# Patient Record
Sex: Male | Born: 1980
Health system: Southern US, Community
[De-identification: ages and names within clinical notes are randomized; demographics above are authoritative.]

## PROBLEM LIST (undated history)

## (undated) DIAGNOSIS — K219 Gastro-esophageal reflux disease without esophagitis: Secondary | ICD-10-CM

## (undated) DIAGNOSIS — R0683 Snoring: Secondary | ICD-10-CM

## (undated) DIAGNOSIS — I10 Essential (primary) hypertension: Secondary | ICD-10-CM

## (undated) HISTORY — PX: CHOLECYSTECTOMY: SHX55

## (undated) HISTORY — PX: OTHER SURGICAL HISTORY: SHX169

## (undated) HISTORY — DX: Gastro-esophageal reflux disease without esophagitis: K21.9

---

## 2004-03-12 ENCOUNTER — Ambulatory Visit (HOSPITAL_BASED_OUTPATIENT_CLINIC_OR_DEPARTMENT_OTHER): Admission: RE | Admit: 2004-03-12 | Discharge: 2004-03-12 | Payer: Self-pay | Admitting: Otolaryngology

## 2006-02-08 ENCOUNTER — Emergency Department: Payer: Self-pay | Admitting: Emergency Medicine

## 2015-06-23 ENCOUNTER — Emergency Department (HOSPITAL_COMMUNITY)
Admission: EM | Admit: 2015-06-23 | Discharge: 2015-06-23 | Disposition: A | Discharge status: Home / Self Care | Payer: No Typology Code available for payment source | Attending: Emergency Medicine | Admitting: Emergency Medicine

## 2015-06-23 ENCOUNTER — Encounter (HOSPITAL_COMMUNITY): Payer: Self-pay | Admitting: Emergency Medicine

## 2015-06-23 ENCOUNTER — Emergency Department (HOSPITAL_COMMUNITY): Payer: No Typology Code available for payment source

## 2015-06-23 DIAGNOSIS — K529 Noninfective gastroenteritis and colitis, unspecified: Secondary | ICD-10-CM | POA: Diagnosis not present

## 2015-06-23 DIAGNOSIS — R109 Unspecified abdominal pain: Secondary | ICD-10-CM | POA: Diagnosis present

## 2015-06-23 LAB — LIPASE, BLOOD: Lipase: 37 U/L (ref 11–51)

## 2015-06-23 LAB — COMPREHENSIVE METABOLIC PANEL
ALK PHOS: 83 U/L (ref 38–126)
ALT: 93 U/L — ABNORMAL HIGH (ref 17–63)
AST: 41 U/L (ref 15–41)
Albumin: 4.3 g/dL (ref 3.5–5.0)
Anion gap: 6 (ref 5–15)
BILIRUBIN TOTAL: 0.7 mg/dL (ref 0.3–1.2)
BUN: 14 mg/dL (ref 6–20)
CALCIUM: 9.7 mg/dL (ref 8.9–10.3)
CO2: 27 mmol/L (ref 22–32)
Chloride: 103 mmol/L (ref 101–111)
Creatinine, Ser: 1.18 mg/dL (ref 0.61–1.24)
GFR calc Af Amer: >60 mL/min (ref >60)
Glucose, Bld: 133 mg/dL — ABNORMAL HIGH (ref 65–99)
POTASSIUM: 5.3 mmol/L — AB (ref 3.5–5.1)
Sodium: 136 mmol/L (ref 135–145)
TOTAL PROTEIN: 7.7 g/dL (ref 6.5–8.1)

## 2015-06-23 LAB — URINALYSIS, ROUTINE W REFLEX MICROSCOPIC
BILIRUBIN URINE: NEGATIVE
Glucose, UA: NEGATIVE mg/dL
Hgb urine dipstick: NEGATIVE
KETONES UR: NEGATIVE mg/dL
LEUKOCYTES UA: NEGATIVE
NITRITE: NEGATIVE
PH: 6.5 (ref 5.0–8.0)
PROTEIN: NEGATIVE mg/dL
Specific Gravity, Urine: 1.026 (ref 1.005–1.030)

## 2015-06-23 LAB — CBC
HEMATOCRIT: 44.5 % (ref 39.0–52.0)
Hemoglobin: 15.9 g/dL (ref 13.0–17.0)
MCH: 32.8 pg (ref 26.0–34.0)
MCHC: 35.7 g/dL (ref 30.0–36.0)
MCV: 91.8 fL (ref 78.0–100.0)
PLATELETS: 229 10*3/uL (ref 150–400)
RBC: 4.85 MIL/uL (ref 4.22–5.81)
RDW: 12.3 % (ref 11.5–15.5)
WBC: 8.6 10*3/uL (ref 4.0–10.5)

## 2015-06-23 MED ORDER — ONDANSETRON 4 MG PO TBDP: 4.0000 mg | ORAL_TABLET | Freq: Three times a day (TID) | ORAL | Status: DC | PRN

## 2015-06-23 MED ORDER — DICYCLOMINE HCL 20 MG PO TABS: 20.0000 mg | ORAL_TABLET | Freq: Two times a day (BID) | ORAL | Status: DC

## 2015-06-23 MED ORDER — GI COCKTAIL ~~LOC~~
30.0000 mL | Freq: Once | ORAL | Status: AC
  Administered 2015-06-23: 30 mL via ORAL
  Filled 2015-06-23: qty 30

## 2015-06-23 MED ORDER — ONDANSETRON HCL 4 MG/2ML IJ SOLN
4.0000 mg | Freq: Once | INTRAMUSCULAR | Status: AC
  Administered 2015-06-23: 4 mg via INTRAVENOUS
  Filled 2015-06-23: qty 2

## 2015-06-23 MED ORDER — DICYCLOMINE HCL 10 MG PO CAPS
10.0000 mg | ORAL_CAPSULE | Freq: Once | ORAL | Status: AC
  Administered 2015-06-23: 10 mg via ORAL
  Filled 2015-06-23: qty 1

## 2015-06-23 MED ORDER — MORPHINE SULFATE (PF) 4 MG/ML IV SOLN
4.0000 mg | Freq: Once | INTRAVENOUS | Status: AC
  Administered 2015-06-23: 4 mg via INTRAVENOUS
  Filled 2015-06-23: qty 1

## 2015-06-23 MED ORDER — SODIUM CHLORIDE 0.9 % IV BOLUS (SEPSIS)
1000.0000 mL | Freq: Once | INTRAVENOUS | Status: AC
  Administered 2015-06-23: 1000 mL via INTRAVENOUS

## 2015-06-23 MED ORDER — ONDANSETRON 4 MG PO TBDP
ORAL_TABLET | ORAL | Status: AC
  Filled 2015-06-23: qty 1

## 2015-06-23 MED ORDER — ONDANSETRON 4 MG PO TBDP
4.0000 mg | ORAL_TABLET | Freq: Once | ORAL | Status: AC
  Administered 2015-06-23: 4 mg via ORAL

## 2015-06-23 MED ORDER — IOHEXOL 300 MG/ML  SOLN
100.0000 mL | Freq: Once | INTRAMUSCULAR | Status: AC | PRN
  Administered 2015-06-23: 100 mL via INTRAVENOUS

## 2015-06-23 NOTE — ED Notes (Signed)
Pt. Stated, I started having N/V/D last night around 0100

## 2015-06-23 NOTE — ED Provider Notes (Signed)
CSN: [id number]     Arrival date & time 06/23/15  7829 History   First MD Initiated Contact with Patient 06/23/15 1106     Chief Complaint  Patient presents with  . Emesis  . Diarrhea  . Abdominal Pain     (Consider location/radiation/quality/duration/timing/severity/associated sxs/prior Treatment) HPI Comments: Periumbilical pain, burning, now 5/10 N/V greater than 15 times since 1AM Diarrhea initially 4-5 times No blood Hot before emesis Havent tried eating/drinking since eit start GB out one month ago No sick contacts    Patient is a 34 y.o. male presenting with vomiting, diarrhea, and abdominal pain.  Emesis Associated symptoms: abdominal pain and diarrhea   Associated symptoms: no headaches and no sore throat   Diarrhea Associated symptoms: abdominal pain and vomiting   Associated symptoms: no fever and no headaches   Abdominal Pain Associated symptoms: diarrhea and vomiting   Associated symptoms: no chest pain, no fever, no shortness of breath and no sore throat     History reviewed. No pertinent past medical history. History reviewed. No pertinent past surgical history. No family history on file. Social History  Substance Use Topics  . Smoking status: Never Smoker   . Smokeless tobacco: None  . Alcohol Use: No    Review of Systems  Constitutional: Negative for fever.  HENT: Negative for sore throat.   Eyes: Negative for visual disturbance.  Respiratory: Negative for shortness of breath.   Cardiovascular: Negative for chest pain.  Gastrointestinal: Positive for vomiting, abdominal pain and diarrhea.  Genitourinary: Negative for difficulty urinating.  Musculoskeletal: Negative for back pain and neck stiffness.  Skin: Negative for rash.  Neurological: Negative for syncope and headaches.      Allergies  Review of patient's allergies indicates no known allergies.  Home Medications   Prior to Admission medications   Medication Sig Start Date End  Date Taking? Authorizing Provider  dicyclomine (BENTYL) 20 MG tablet Take 1 tablet (20 mg total) by mouth 2 (two) times daily. 06/23/15   Alvira Monday, MD  ondansetron (ZOFRAN ODT) 4 MG disintegrating tablet Take 1 tablet (4 mg total) by mouth every 8 (eight) hours as needed for nausea or vomiting. 06/23/15   Alvira Monday, MD   BP 146/91 mmHg  Pulse 75  Temp(Src) 98 F (36.7 C) (Oral)  Resp 16  SpO2 100% Physical Exam  Constitutional: He is oriented to person, place, and time. He appears well-developed and well-nourished. No distress.  HENT:  Head: Normocephalic and atraumatic.  Eyes: Conjunctivae and EOM are normal.  Neck: Normal range of motion.  Cardiovascular: Normal rate, regular rhythm, normal heart sounds and intact distal pulses.  Exam reveals no gallop and no friction rub.   No murmur heard. Pulmonary/Chest: Effort normal and breath sounds normal. No respiratory distress. He has no wheezes. He has no rales.  Abdominal: Soft. He exhibits no distension. There is tenderness (mild right sided). There is no guarding.  Musculoskeletal: He exhibits no edema.  Neurological: He is alert and oriented to person, place, and time.  Skin: Skin is warm and dry. He is not diaphoretic.  Nursing note and vitals reviewed.   ED Course  Procedures (including critical care time) Labs Review Labs Reviewed  COMPREHENSIVE METABOLIC PANEL - Abnormal; Notable for the following:    Potassium 5.3 (*)    Glucose, Bld 133 (*)    ALT 93 (*)    All other components within normal limits  LIPASE, BLOOD  CBC  URINALYSIS, ROUTINE W REFLEX MICROSCOPIC (NOT  AT [facility])    Imaging Review Ct Abdomen Pelvis W Contrast  06/23/2015  CLINICAL DATA:  Diffuse abdominal pain, nausea vomiting. Status post cholecystectomy 4 weeks ago. EXAM: CT ABDOMEN AND PELVIS WITH CONTRAST TECHNIQUE: Multidetector CT imaging of the abdomen and pelvis was performed using the standard protocol following bolus administration of  intravenous contrast. CONTRAST:  100mL OMNIPAQUE IOHEXOL 300 MG/ML  SOLN COMPARISON:  None. FINDINGS: Lower chest:  No acute findings. Hepatobiliary: No masses or other significant abnormality. There has been a prior cholecystectomy. There is no evidence of inflammatory changes or fluid collection within the cholecystectomy bed. Pancreas: No mass, inflammatory changes, or other significant abnormality. Spleen: Within normal limits in size and appearance. Adrenals/Urinary Tract: No masses identified. No evidence of hydronephrosis. Stomach/Bowel: No evidence of obstruction, or abnormal fluid collections. There is nonspecific symmetric bowel wall thickening of the right colon. Appendix is normal. Vascular/Lymphatic: No pathologically enlarged lymph nodes. No evidence of abdominal aortic aneurysm. Reproductive: No mass or other significant abnormality. Other: None. Musculoskeletal:  No suspicious bone lesions identified. IMPRESSION: No evidence of CT abnormalities within the solid abdominal organs. Nonspecific bowel wall thickening of the right colon, likely infectious or inflammatory. Normal appearance of the cholecystectomy bed. Electronically Signed   By: Ted Mcalpineobrinka  Dimitrova M.D.   On: 06/23/2015 14:28   I have personally reviewed and evaluated these images and lab results as part of my medical decision-making.   EKG Interpretation   Date/Time:  Friday June 23 2015 12:03:17 EST Ventricular Rate:  67 PR Interval:  135 QRS Duration: 85 QT Interval:  419 QTC Calculation: 442 R Axis:   81 Text Interpretation:  Sinus rhythm ST elev, probable normal early repol  pattern No previous ECGs available Confirmed by [facility] Inc[facility]LOSSM[facility] MD, Torry Istre  ([id number]) on 06/23/2015 4:40:33 PM      MDM   Final diagnoses:  Gastroenteritis   34 yo male with hx of recent cholecystectomy presents with concern of abdominal pain, nausea, vomiting and diarrhea.  History consistent with likely viral gastroenteritis, however pt began  to report worsening abdominal pain in ED and given recent surgery, CT was ordered to eval for SBO/abscess and showed no acute abnormalities.  Pt with mild hyperkalemia of 5.3, EKG without changes, and given diarrhea and only mild elevation with normal renal function, it was recommended he follow up closely with PCP. Pt most likely with gastroenteritis. Given rx for zofran/bentyl. Patient discharged in stable condition with understanding of reasons to return.   Alvira MondayErin Rylie Knierim, MD 06/23/15 [phone]

## 2015-06-23 NOTE — ED Notes (Signed)
Pt c/o return of abd cramping - EDP aware.

## 2015-07-05 ENCOUNTER — Ambulatory Visit (INDEPENDENT_AMBULATORY_CARE_PROVIDER_SITE_OTHER): Payer: No Typology Code available for payment source | Admitting: Gastroenterology

## 2015-07-05 ENCOUNTER — Encounter: Payer: Self-pay | Admitting: Gastroenterology

## 2015-07-05 VITALS — BP 141/93 | HR 70 | Temp 98.8°F | Ht 75.0 in | Wt 301.0 lb

## 2015-07-05 DIAGNOSIS — G8929 Other chronic pain: Secondary | ICD-10-CM

## 2015-07-05 DIAGNOSIS — K219 Gastro-esophageal reflux disease without esophagitis: Secondary | ICD-10-CM | POA: Diagnosis not present

## 2015-07-05 DIAGNOSIS — R197 Diarrhea, unspecified: Secondary | ICD-10-CM

## 2015-07-05 DIAGNOSIS — R1013 Epigastric pain: Secondary | ICD-10-CM | POA: Diagnosis not present

## 2015-07-05 MED ORDER — RANITIDINE HCL 150 MG PO TABS: 150.0000 mg | ORAL_TABLET | Freq: Two times a day (BID) | ORAL | Status: DC

## 2015-07-05 MED ORDER — DICYCLOMINE HCL 20 MG PO TABS: 20.0000 mg | ORAL_TABLET | Freq: Two times a day (BID) | ORAL | Status: DC

## 2015-07-05 MED ORDER — RANITIDINE HCL 150 MG PO TABS
150.0000 mg | ORAL_TABLET | Freq: Two times a day (BID) | ORAL | Status: DC
Start: 2015-07-05 — End: 2015-07-05

## 2015-07-06 NOTE — Progress Notes (Signed)
Gastroenterology Consultation  Referring Provider:     No ref. provider found Primary Care Physician:  No PCP Per Patient Primary Gastroenterologist:  Dr. Servando Snare     Reason for Consultation:     Diarrhea and heartburn        HPI:   Eddie Daniels is a 34 y.o. y/o male referred for consultation & management of  Diarrhea and heartburn by Dr. Bonnetta Barry PCP Per Patient.   This patient reports that he was in the emergency Department after having chest pain that he thought was heartburn. The patient also was having diarrhea. He states that after having a Thanksgiving dinner he started to have severe chest pain/epigastric pain with nausea and vomiting that would not stop. The patient also reports that he was having diarrhea at the same time. He was taken to the emergency room by his family and he was treated with dicyclomine and nausea medication. He states that he also received the pain killer in the emergency room. His nausea quickly resolved that he reports that he continues to have loose bowel movements. When he was vomiting he does report seeing a small amount of blood in his vomitus. There is no report of any unexplained weight loss, fevers, chills or family history of inflammatory bowel disease. The patient also reports that he has had a similar episode of this with mostly diarrhea in the past just once.He now reports that he has episodes of the abdominal pain that can come at any time including when he is just sitting at his desk.  Past Medical History  Diagnosis Date  . GERD (gastroesophageal reflux disease)     Past Surgical History  Procedure Laterality Date  . Cholecystectomy    . Left hand surgery      Broken at age 86    Prior to Admission medications   Medication Sig Start Date End Date Taking? Authorizing Provider  dicyclomine (BENTYL) 20 MG tablet Take 1 tablet (20 mg total) by mouth 2 (two) times daily. 07/05/15   Midge Minium, MD  ondansetron (ZOFRAN ODT) 4 MG disintegrating tablet  Take 1 tablet (4 mg total) by mouth every 8 (eight) hours as needed for nausea or vomiting. Patient not taking: Reported on 07/05/2015 06/23/15   Alvira Monday, MD  ranitidine (ZANTAC) 150 MG tablet Take 1 tablet (150 mg total) by mouth 2 (two) times daily. 07/05/15   Midge Minium, MD    Family History  Problem Relation Age of Onset  . Diverticulitis Mother   . Stroke Father   . Heart disease Father      Social History  Substance Use Topics  . Smoking status: Never Smoker   . Smokeless tobacco: Never Used  . Alcohol Use: No    Allergies as of 07/05/2015  . (No Known Allergies)    Review of Systems:    All systems reviewed and negative except where noted in HPI.   Physical Exam:  BP 141/93 mmHg  Pulse 70  Temp(Src) 98.8 F (37.1 C) (Oral)  Ht  (1.905 m)  Wt 301 lb (136.533 kg)  BMI 37.62 kg/m2 No LMP for male patient. Psych:  Alert and cooperative. Normal mood and affect. General:   Alert,  Well-developed, well-nourished, pleasant and cooperative in NAD Head:  Normocephalic and atraumatic. Eyes:  Sclera clear, no icterus.   Conjunctiva pink. Ears:  Normal auditory acuity. Nose:  No deformity, discharge, or lesions. Mouth:  No deformity or lesions,oropharynx pink & moist. Neck:  Supple; no masses or thyromegaly. Lungs:  Respirations even and unlabored.  Clear throughout to auscultation.   No wheezes, crackles, or rhonchi. No acute distress. Heart:  Regular rate and rhythm; no murmurs, clicks, rubs, or gallops. Abdomen:  Normal bowel sounds.  No bruits.  Soft, non-tender and non-distended without masses, hepatosplenomegaly or hernias noted.  No guarding or rebound tenderness.  Negative Carnett sign.   Rectal:  Deferred.  Msk:  Symmetrical without gross deformities.  Good, equal movement & strength bilaterally. Pulses:  Normal pulses noted. Extremities:  No clubbing or edema.  No cyanosis. Neurologic:  Alert and oriented x3;  grossly normal neurologically. Skin:   Intact without significant lesions or rashes.  No jaundice. Lymph Nodes:  No significant cervical adenopathy. Psych:  Alert and cooperative. Normal mood and affect.  Imaging Studies: Ct Abdomen Pelvis W Contrast  06/23/2015  CLINICAL DATA:  Diffuse abdominal pain, nausea vomiting. Status post cholecystectomy 4 weeks ago. EXAM: CT ABDOMEN AND PELVIS WITH CONTRAST TECHNIQUE: Multidetector CT imaging of the abdomen and pelvis was performed using the standard protocol following bolus administration of intravenous contrast. CONTRAST:  100mL OMNIPAQUE IOHEXOL 300 MG/ML  SOLN COMPARISON:  None. FINDINGS: Lower chest:  No acute findings. Hepatobiliary: No masses or other significant abnormality. There has been a prior cholecystectomy. There is no evidence of inflammatory changes or fluid collection within the cholecystectomy bed. Pancreas: No mass, inflammatory changes, or other significant abnormality. Spleen: Within normal limits in size and appearance. Adrenals/Urinary Tract: No masses identified. No evidence of hydronephrosis. Stomach/Bowel: No evidence of obstruction, or abnormal fluid collections. There is nonspecific symmetric bowel wall thickening of the right colon. Appendix is normal. Vascular/Lymphatic: No pathologically enlarged lymph nodes. No evidence of abdominal aortic aneurysm. Reproductive: No mass or other significant abnormality. Other: None. Musculoskeletal:  No suspicious bone lesions identified. IMPRESSION: No evidence of CT abnormalities within the solid abdominal organs. Nonspecific bowel wall thickening of the right colon, likely infectious or inflammatory. Normal appearance of the cholecystectomy bed. Electronically Signed   By: Ted Mcalpineobrinka  Dimitrova M.D.   On: 06/23/2015 14:28    Assessment and Plan:   Eddie Daniels is a 34 y.o. y/o male  Who comes in today with epigastric pain and a history of nausea and vomiting with diarrhea. The patient had a CT scan that showed some possible  inflammation in the colon. The patient will be set up for a EGD and colonoscopy. The symptoms went away when the patient was taking dicyclomine and he has been told to go back on that. He will follow up at the time of the procedures.I have discussed risks & benefits which include, but are not limited to, bleeding, infection, perforation & drug reaction.  The patient agrees with this plan & written consent will be obtained.      Note: This dictation was prepared with Dragon dictation along with smaller phrase technology. Any transcriptional errors that result from this process are unintentional.

## 2015-07-10 ENCOUNTER — Encounter: Payer: Self-pay | Admitting: *Deleted

## 2015-07-11 NOTE — Discharge Instructions (Signed)

## 2015-07-13 ENCOUNTER — Ambulatory Visit
Admission: RE | Admit: 2015-07-13 | Discharge: 2015-07-13 | Disposition: A | Discharge status: Home / Self Care | Payer: No Typology Code available for payment source | Source: Ambulatory Visit | Attending: Gastroenterology | Admitting: Gastroenterology

## 2015-07-13 ENCOUNTER — Encounter
Admission: RE | Disposition: A | Discharge status: Home / Self Care | Payer: Self-pay | Source: Ambulatory Visit | Attending: Gastroenterology

## 2015-07-13 ENCOUNTER — Ambulatory Visit: Payer: No Typology Code available for payment source | Admitting: Anesthesiology

## 2015-07-13 ENCOUNTER — Other Ambulatory Visit: Payer: Self-pay | Admitting: Gastroenterology

## 2015-07-13 DIAGNOSIS — K2950 Unspecified chronic gastritis without bleeding: Secondary | ICD-10-CM | POA: Diagnosis not present

## 2015-07-13 DIAGNOSIS — K529 Noninfective gastroenteritis and colitis, unspecified: Secondary | ICD-10-CM | POA: Diagnosis not present

## 2015-07-13 DIAGNOSIS — K2100 Gastro-esophageal reflux disease with esophagitis, without bleeding: Secondary | ICD-10-CM | POA: Insufficient documentation

## 2015-07-13 DIAGNOSIS — I10 Essential (primary) hypertension: Secondary | ICD-10-CM | POA: Diagnosis not present

## 2015-07-13 DIAGNOSIS — K641 Second degree hemorrhoids: Secondary | ICD-10-CM | POA: Insufficient documentation

## 2015-07-13 DIAGNOSIS — R12 Heartburn: Secondary | ICD-10-CM | POA: Insufficient documentation

## 2015-07-13 DIAGNOSIS — K2970 Gastritis, unspecified, without bleeding: Secondary | ICD-10-CM | POA: Diagnosis not present

## 2015-07-13 DIAGNOSIS — R197 Diarrhea, unspecified: Secondary | ICD-10-CM | POA: Insufficient documentation

## 2015-07-13 HISTORY — DX: Essential (primary) hypertension: I10

## 2015-07-13 HISTORY — DX: Snoring: R06.83

## 2015-07-13 HISTORY — PX: ESOPHAGOGASTRODUODENOSCOPY (EGD) WITH PROPOFOL: SHX5813

## 2015-07-13 HISTORY — PX: COLONOSCOPY WITH PROPOFOL: SHX5780

## 2015-07-13 SURGERY — COLONOSCOPY WITH PROPOFOL
Anesthesia: Monitor Anesthesia Care | Wound class: Contaminated

## 2015-07-13 MED ORDER — PROPOFOL 10 MG/ML IV BOLUS
INTRAVENOUS | Status: DC | PRN
  Administered 2015-07-13 (×2): 30 mg via INTRAVENOUS
  Administered 2015-07-13: 100 mg via INTRAVENOUS
  Administered 2015-07-13 (×4): 50 mg via INTRAVENOUS
  Administered 2015-07-13: 20 mg via INTRAVENOUS

## 2015-07-13 MED ORDER — GLYCOPYRROLATE 0.2 MG/ML IJ SOLN
INTRAMUSCULAR | Status: DC | PRN
  Administered 2015-07-13: 0.2 mg via INTRAVENOUS

## 2015-07-13 MED ORDER — STERILE WATER FOR IRRIGATION IR SOLN
Status: DC | PRN
  Administered 2015-07-13: 09:00:00

## 2015-07-13 MED ORDER — LACTATED RINGERS IV SOLN
INTRAVENOUS | Status: DC
  Administered 2015-07-13: 08:00:00 via INTRAVENOUS

## 2015-07-13 MED ORDER — LIDOCAINE HCL (CARDIAC) 20 MG/ML IV SOLN
INTRAVENOUS | Status: DC | PRN
  Administered 2015-07-13: 20 mg via INTRAVENOUS

## 2015-07-13 SURGICAL SUPPLY — 39 items
BALLN DILATOR 10-12 8 (BALLOONS)
BALLN DILATOR 12-15 8 (BALLOONS)
BALLN DILATOR 15-18 8 (BALLOONS)
BALLN DILATOR CRE 0-12 8 (BALLOONS)
BALLN DILATOR ESOPH 8 10 CRE (MISCELLANEOUS) IMPLANT
BALLOON DILATOR 12-15 8 (BALLOONS) IMPLANT
BALLOON DILATOR 15-18 8 (BALLOONS) IMPLANT
BALLOON DILATOR CRE 0-12 8 (BALLOONS) IMPLANT
BLOCK BITE 60FR ADLT L/F GRN (MISCELLANEOUS) ×3 IMPLANT
CANISTER SUCT 1200ML W/VALVE (MISCELLANEOUS) ×3 IMPLANT
FCP ESCP3.2XJMB 240X2.8X (MISCELLANEOUS)
FORCEPS BIOP RAD 4 LRG CAP 4 (CUTTING FORCEPS) ×2 IMPLANT
FORCEPS BIOP RJ4 240 W/NDL (MISCELLANEOUS)
FORCEPS ESCP3.2XJMB 240X2.8X (MISCELLANEOUS) IMPLANT
GOWN CVR UNV OPN BCK APRN NK (MISCELLANEOUS) ×2 IMPLANT
GOWN ISOL THUMB LOOP REG UNIV (MISCELLANEOUS) ×6
HEMOCLIP INSTINCT (CLIP) IMPLANT
INJECTOR VARIJECT VIN23 (MISCELLANEOUS) IMPLANT
KIT CO2 TUBING (TUBING) IMPLANT
KIT DEFENDO VALVE AND CONN (KITS) IMPLANT
KIT ENDO PROCEDURE OLY (KITS) ×3 IMPLANT
LIGATOR MULTIBAND 6SHOOTER MBL (MISCELLANEOUS) IMPLANT
MARKER SPOT ENDO TATTOO 5ML (MISCELLANEOUS) IMPLANT
PAD GROUND ADULT SPLIT (MISCELLANEOUS) IMPLANT
SNARE SHORT THROW 13M SML OVAL (MISCELLANEOUS) IMPLANT
SNARE SHORT THROW 30M LRG OVAL (MISCELLANEOUS) IMPLANT
SPOT EX ENDOSCOPIC TATTOO (MISCELLANEOUS)
SUCTION POLY TRAP 4CHAMBER (MISCELLANEOUS) IMPLANT
SYR INFLATION 60ML (SYRINGE) IMPLANT
TRAP SUCTION POLY (MISCELLANEOUS) IMPLANT
TUBING CONN 6MMX3.1M (TUBING)
TUBING SUCTION CONN 0.25 STRL (TUBING) IMPLANT
UNDERPAD 30X60 958B10 (PK) (MISCELLANEOUS) IMPLANT
VALVE BIOPSY ENDO (VALVE) IMPLANT
VARIJECT INJECTOR VIN23 (MISCELLANEOUS)
WATER AUXILLARY (MISCELLANEOUS) IMPLANT
WATER STERILE IRR 250ML POUR (IV SOLUTION) ×3 IMPLANT
WATER STERILE IRR 500ML POUR (IV SOLUTION) IMPLANT
WIRE CRE 18-20MM 8CM F G (MISCELLANEOUS) IMPLANT

## 2015-07-13 NOTE — Anesthesia Postprocedure Evaluation (Signed)
Anesthesia Post Note  Patient: Eddie Daniels  Procedure(s) Performed: Procedure(s) (LRB): COLONOSCOPY WITH PROPOFOL (N/A) ESOPHAGOGASTRODUODENOSCOPY (EGD) WITH PROPOFOL (N/A)  Patient location during evaluation: PACU Anesthesia Type: MAC Level of consciousness: awake and alert and oriented Pain management: satisfactory to patient Vital Signs Assessment: post-procedure vital signs reviewed and stable Respiratory status: spontaneous breathing, nonlabored ventilation and respiratory function stable Cardiovascular status: blood pressure returned to baseline and stable Postop Assessment: Adequate PO intake and No signs of nausea or vomiting Anesthetic complications: no    Cherly BeachStella, Gordon Carlson J

## 2015-07-13 NOTE — Anesthesia Preprocedure Evaluation (Signed)
Anesthesia Evaluation  Patient identified by MRN, date of birth, ID band  Reviewed: Allergy & Precautions, H&P , NPO status , Patient's Chart, lab work & pertinent test results  Airway Mallampati: I  TM Distance: >3 FB Neck ROM: full    Dental no notable dental hx.    Pulmonary    Pulmonary exam normal        Cardiovascular hypertension,  Rhythm:regular Rate:Normal     Neuro/Psych    GI/Hepatic GERD  ,  Endo/Other    Renal/GU      Musculoskeletal   Abdominal   Peds  Hematology   Anesthesia Other Findings   Reproductive/Obstetrics                             Anesthesia Physical Anesthesia Plan  ASA: II  Anesthesia Plan: MAC   Post-op Pain Management:    Induction:   Airway Management Planned:   Additional Equipment:   Intra-op Plan:   Post-operative Plan:   Informed Consent: I have reviewed the patients History and Physical, chart, labs and discussed the procedure including the risks, benefits and alternatives for the proposed anesthesia with the patient or authorized representative who has indicated his/her understanding and acceptance.     Plan Discussed with: CRNA  Anesthesia Plan Comments:         Anesthesia Quick Evaluation

## 2015-07-13 NOTE — Anesthesia Procedure Notes (Signed)
Procedure Name: MAC Performed by: Baani Bober Pre-anesthesia Checklist: Patient identified, Emergency Drugs available, Suction available, Patient being monitored and Timeout performed Patient Re-evaluated:Patient Re-evaluated prior to inductionOxygen Delivery Method: Nasal cannula       

## 2015-07-13 NOTE — Op Note (Signed)
[facility] G[facility]enter[facility] Patient Name: Eddie FarmerKory Sopko Procedure Date: 07/13/2015 8:35 AM MRN: [id number] Account #: [id number] Date of Birth: 01/26/1981 Admit Type: Outpatient Age: 34 Room: [facility] 01 Gender: Male Note Status: Finalized Procedure:         Colonoscopy Indications:       Chronic diarrhea Providers:         Midge Miniumarren Coulter Oldaker, MD Medicines:         Propofol per Anesthesia Complications:     No immediate complications. Procedure:         Pre-Anesthesia Assessment:                    - Prior to the procedure, a History and Physical was                     performed, and patient medications and allergies were                     reviewed. The patient's tolerance of previous anesthesia                     was also reviewed. The risks and benefits of the procedure                     and the sedation options and risks were discussed with the                     patient. All questions were answered, and informed consent                     was obtained. Prior Anticoagulants: The patient has taken                     no previous anticoagulant or antiplatelet agents. ASA                     Grade Assessment: II - A patient with mild systemic                     disease. After reviewing the risks and benefits, the                     patient was deemed in satisfactory condition to undergo                     the procedure.                    After obtaining informed consent, the colonoscope was                     passed under direct vision. Throughout the procedure, the                     patient's blood pressure, pulse, and oxygen saturations                     were monitored continuously. The Olympus CF-HQ190L                     Colonoscope (S#. [id number]) was introduced through the anus                     and advanced to the the terminal ileum. The colonoscopy  was performed without difficulty. The patient tolerated     the procedure well. The quality of the bowel preparation                     was excellent. Findings:      The perianal and digital rectal examinations were normal.      The terminal ileum appeared normal. Three biopsies were obtained in the       terminal ileum with cold forceps for histology.      Non-bleeding internal hemorrhoids were found during retroflexion. The       hemorrhoids were Grade II (internal hemorrhoids that prolapse but reduce       spontaneously).      Several biopsies were obtained with cold forceps for histology randomly       in the entire colon. Impression:        - The examined portion of the ileum was normal.                    - Non-bleeding internal hemorrhoids.                    - Three biopsies were obtained in the terminal ileum.                    - Several biopsies were obtained in the entire colon. Recommendation:    - Await pathology results. Procedure Code(s): --- Professional ---                    [phone], Colonoscopy, flexible; with biopsy, single or                     multiple Diagnosis Code(s): --- Professional ---                    K52.9, Noninfective gastroenteritis and colitis,                     unspecified                    K64.1, Second degree hemorrhoids CPT copyright 2014 American Medical Association. All rights reserved. The codes documented in this report are preliminary and upon coder review may  be revised to meet current compliance requirements. Midge Minium, MD 07/13/2015 9:02:56 AM This report has been signed electronically. Number of Addenda: 0 Note Initiated On: 07/13/2015 8:35 AM Scope Withdrawal Time: 0 hours 6 minutes 42 seconds  Total Procedure Duration: 0 hours 9 minutes 1 second       [facility]

## 2015-07-13 NOTE — H&P (Signed)
  [facility]  [address] [place], [place] [address] Phone: [phone] Fax : [phone]  Primary Care Physician:  No PCP Per Patient Primary Gastroenterologist:  Dr. Servando SnareWohl  Pre-Procedure History & Physical: HPI:  Eddie Daniels is a 34 y.o. male is here for an endoscopy and colonoscopy.   Past Medical History  Diagnosis Date  . GERD (gastroesophageal reflux disease)   . Hypertension     questionable.  said BP has been up when checked but has not seen PCP about it  . Snores     Past Surgical History  Procedure Laterality Date  . Cholecystectomy    . Left hand surgery      Broken at age 34    Prior to Admission medications   Medication Sig Start Date End Date Taking? Authorizing Provider  dicyclomine (BENTYL) 20 MG tablet Take 1 tablet (20 mg total) by mouth 2 (two) times daily. 07/05/15  Yes Midge Miniumarren Asuzena Weis, MD  ranitidine (ZANTAC) 150 MG tablet Take 1 tablet (150 mg total) by mouth 2 (two) times daily. 07/05/15  Yes Kiara Keep Servando SnareWohl, MD  ondansetron (ZOFRAN ODT) 4 MG disintegrating tablet Take 1 tablet (4 mg total) by mouth every 8 (eight) hours as needed for nausea or vomiting. Patient not taking: Reported on 07/05/2015 06/23/15   Alvira MondayErin Schlossman, MD    Allergies as of 07/05/2015  . (No Known Allergies)    Family History  Problem Relation Age of Onset  . Diverticulitis Mother   . Stroke Father   . Heart disease Father     Social History   Social History  . Marital Status: Single    Spouse Name: N/A  . Number of Children: N/A  . Years of Education: N/A   Occupational History  . Not on file.   Social History Main Topics  . Smoking status: Never Smoker   . Smokeless tobacco: Never Used  . Alcohol Use: No  . Drug Use: No  . Sexual Activity: Not on file   Other Topics Concern  . Not on file   Social History Narrative    Review of Systems: See HPI, otherwise negative ROS  Physical Exam: BP 148/92 mmHg  Pulse 79  Temp(Src) 97.9 F  (36.6 C) (Temporal)  Resp 16  Ht 6\' 3"  (1.905 m)  Wt 288 lb (130.636 kg)  BMI 36.00 kg/m2  SpO2 99% General:   Alert,  pleasant and cooperative in NAD Head:  Normocephalic and atraumatic. Neck:  Supple; no masses or thyromegaly. Lungs:  Clear throughout to auscultation.    Heart:  Regular rate and rhythm. Abdomen:  Soft, nontender and nondistended. Normal bowel sounds, without guarding, and without rebound.   Neurologic:  Alert and  oriented x4;  grossly normal neurologically.  Impression/Plan: Eddie Daniels is here for an endoscopy and colonoscopy to be performed for diarrhea and GERD  Risks, benefits, limitations, and alternatives regarding  endoscopy and colonoscopy have been reviewed with the patient.  Questions have been answered.  All parties agreeable.   Darlina Rumpfaren Denard Tuminello, MD  07/13/2015, 8:33 AM

## 2015-07-13 NOTE — Transfer of Care (Signed)
Immediate Anesthesia Transfer of Care Note  Patient: Eddie LassoKory D Lenhoff  Procedure(s) Performed: Procedure(s): COLONOSCOPY WITH PROPOFOL (N/A) ESOPHAGOGASTRODUODENOSCOPY (EGD) WITH PROPOFOL (N/A)  Patient Location: PACU  Anesthesia Type: MAC  Level of Consciousness: awake, alert  and patient cooperative  Airway and Oxygen Therapy: Patient Spontanous Breathing and Patient connected to supplemental oxygen  Post-op Assessment: Post-op Vital signs reviewed, Patient's Cardiovascular Status Stable, Respiratory Function Stable, Patent Airway and No signs of Nausea or vomiting  Post-op Vital Signs: Reviewed and stable  Complications: No apparent anesthesia complications

## 2015-07-13 NOTE — Op Note (Signed)
[facility] Patient Name: Eddie Daniels Procedure Date: 07/13/2015 8:36 AM MRN: [id number] Account #: [id number] Date of Birth: [birth year] Admit Type: Outpatient Age: 34 Room: Eddie Daniels OR ROOM 01 Gender: Male Note Status: Finalized Procedure:         Upper GI endoscopy Indications:       Heartburn, Diarrhea Providers:         Eddie Minium, MD Medicines:         Propofol per Anesthesia Complications:     No immediate complications. Procedure:         Pre-Anesthesia Assessment:                    - Prior to the procedure, a History and Physical was                     performed, and patient medications and allergies were                     reviewed. The patient's tolerance of previous anesthesia                     was also reviewed. The risks and benefits of the procedure                     and the sedation options and risks were discussed with the                     patient. All questions were answered, and informed consent                     was obtained. Prior Anticoagulants: The patient has taken                     no previous anticoagulant or antiplatelet agents. ASA                     Grade Assessment: II - A patient with mild systemic                     disease. After reviewing the risks and benefits, the                     patient was deemed in satisfactory condition to undergo                     the procedure.                    After obtaining informed consent, the endoscope was passed                     under direct vision. Throughout the procedure, the                     patient's blood pressure, pulse, and oxygen saturations                     were monitored continuously. The Olympus GIF-HQ190                     Endoscope (S#. [id number]) was introduced through the mouth,                     and advanced to the second part of duodenum. The upper  GI                     endoscopy was accomplished without difficulty. The patient                  tolerated the procedure well. Findings:      LA Grade A (one or more mucosal breaks less than 5 mm, not extending       between tops of 2 mucosal folds) esophagitis with no bleeding was found       in the lower third of the esophagus.      Localized mild inflammation characterized by erythema was found in the       gastric antrum. Biopsies were taken with a cold forceps for histology.      The examined duodenum was normal. Biopsies were taken with a cold       forceps for histology. Impression:        - LA Grade A reflux esophagitis.                    - Gastritis. Biopsied.                    - Normal examined duodenum. Biopsied. Recommendation:    - Await pathology results.                    - Perform a colonoscopy today. Procedure Code(s): --- Professional ---                    [phone], Esophagogastroduodenoscopy, flexible, transoral;                     with biopsy, single or multiple Diagnosis Code(s): --- Professional ---                    R12, Heartburn                    R19.7, Diarrhea, unspecified                    K21.0, Gastro-esophageal reflux disease with esophagitis                    K29.70, Gastritis, unspecified, without bleeding CPT copyright 2014 American Medical Association. All rights reserved. The codes documented in this report are preliminary and upon coder review may  be revised to meet current compliance requirements. Eddie Miniumarren Thanh Pomerleau, MD 07/13/2015 8:51:16 AM This report has been signed electronically. Number of Addenda: 0 Note Initiated On: 07/13/2015 8:36 AM Total Procedure Duration: 0 hours 2 minutes 26 seconds       [facility]

## 2015-07-14 ENCOUNTER — Encounter: Payer: Self-pay | Admitting: Gastroenterology

## 2015-07-18 ENCOUNTER — Telehealth: Payer: Self-pay | Admitting: Gastroenterology

## 2015-07-18 NOTE — Telephone Encounter (Signed)
Will discuss with Dr. Servando SnareWohl in am and return phone call to patient.

## 2015-07-18 NOTE — Telephone Encounter (Signed)
Patients wife called asking about results from procedure last Thursday and to talk about the pain he is having, per wife the pain feels like when he had his gallbladder removed. She is thinking if his problems are not GI related she is asking where should he be referred to next?

## 2015-07-19 NOTE — Telephone Encounter (Signed)
Spoke with Dr.Wohl, he said pathology for colon was normal and that patient mentioned at office visit that he had been put on bently at the Emergency room and that seemed to help. So Dr.Wohl was thinking IBS maybe the problem, spoke with patient about this and he mentioned he has not been taking the medication everyday and still had some so he has restarted it and was going to see how he feels. I offered patient an appt for this morning or tomorrow and he cant due to work. Patient said he will call me back first of next week to let me know how he is feeling with taking the medication.

## 2015-08-29 ENCOUNTER — Encounter: Payer: Self-pay | Admitting: Emergency Medicine

## 2015-08-29 ENCOUNTER — Ambulatory Visit
Admission: EM | Admit: 2015-08-29 | Discharge: 2015-08-29 | Disposition: A | Discharge status: Home / Self Care | Payer: No Typology Code available for payment source | Attending: Family Medicine | Admitting: Family Medicine

## 2015-08-29 ENCOUNTER — Ambulatory Visit (INDEPENDENT_AMBULATORY_CARE_PROVIDER_SITE_OTHER): Payer: No Typology Code available for payment source

## 2015-08-29 DIAGNOSIS — S93402A Sprain of unspecified ligament of left ankle, initial encounter: Secondary | ICD-10-CM | POA: Diagnosis not present

## 2015-08-29 MED ORDER — NAPROXEN 500 MG PO TABS: 500.0000 mg | ORAL_TABLET | Freq: Two times a day (BID) | ORAL | Status: DC

## 2015-08-29 NOTE — ED Provider Notes (Signed)
CSN: [id number]     Arrival date & time 08/29/15  1550 History   First MD Initiated Contact with Patient 08/29/15 1708     Chief Complaint  Patient presents with  . Ankle Pain   (Consider location/radiation/quality/duration/timing/severity/associated sxs/prior Treatment) HPI   35 year old male who presents with left lateral ankle pain. 2 days ago he states he was on a ladder that slipped and fell approximately 8 feet landing on his left ankle forced into inversion. He states that he was limping afterwards but when he wore hightop  tightly tied boots he was able to tolerate it. Since that time however the swelling has become more and his pain has not subsided so he decided to come in for evaluation. Most his pain seems to be lateral but he does have some anterior and medial ankle pain as well. He states that he broke that ankle as a child doesn't remember remember the specifics only that he was placed in a cast. He did not sustain any other injuries in the fall. He has worked up until today any after work this evening.  Past Medical History  Diagnosis Date  . GERD (gastroesophageal reflux disease)   . Hypertension     questionable.  said BP has been up when checked but has not seen PCP about it  . Snores    Past Surgical History  Procedure Laterality Date  . Cholecystectomy    . Left hand surgery      Broken at age 89  . Colonoscopy with propofol N/A 07/13/2015    Procedure: COLONOSCOPY WITH PROPOFOL;  Surgeon: Midge Minium, MD;  Location: [facility];  Service: Endoscopy;  Laterality: N/A;  . Esophagogastroduodenoscopy (egd) with propofol N/A 07/13/2015    Procedure: ESOPHAGOGASTRODUODENOSCOPY (EGD) WITH PROPOFOL;  Surgeon: Midge Minium, MD;  Location: [facility];  Service: Endoscopy;  Laterality: N/A;   Family History  Problem Relation Age of Onset  . Diverticulitis Mother   . Stroke Father   . Heart disease Father    Social History  Substance Use Topics  .  Smoking status: Never Smoker   . Smokeless tobacco: Never Used  . Alcohol Use: No    Review of Systems  Musculoskeletal: Positive for joint swelling and gait problem.  All other systems reviewed and are negative.   Allergies  Proton pump inhibitors  Home Medications   Prior to Admission medications   Medication Sig Start Date End Date Taking? Authorizing Provider  dicyclomine (BENTYL) 20 MG tablet Take 1 tablet (20 mg total) by mouth 2 (two) times daily. 07/05/15   Midge Minium, MD  naproxen (NAPROSYN) 500 MG tablet Take 1 tablet (500 mg total) by mouth 2 (two) times daily. 08/29/15   Lutricia Feil, PA-C  ondansetron (ZOFRAN ODT) 4 MG disintegrating tablet Take 1 tablet (4 mg total) by mouth every 8 (eight) hours as needed for nausea or vomiting. Patient not taking: Reported on 07/05/2015 06/23/15   Alvira Monday, MD  ranitidine (ZANTAC) 150 MG tablet Take 1 tablet (150 mg total) by mouth 2 (two) times daily. 07/05/15   Midge Minium, MD   Meds Ordered and Administered this Visit  Medications - No data to display  BP 148/98 mmHg  Pulse 77  Temp(Src) 97.7 F (36.5 C) (Tympanic)  Resp 16  Ht  (1.93 m)  Wt 295 lb (133.811 kg)  BMI 35.92 kg/m2  SpO2 100% No data found.   Physical Exam  Constitutional: He is oriented to person, place,  and time. He appears well-developed and well-nourished.  HENT:  Head: Normocephalic and atraumatic.  Right Ear: External ear normal.  Eyes: Conjunctivae are normal. Pupils are equal, round, and reactive to light.  Neck: Normal range of motion. Neck supple.  Musculoskeletal: Normal range of motion. He exhibits edema and tenderness.  Examination of the left ankle shows limited range of motion. Most of the swelling is over the lateral malleolus and inferior he does have some swelling of the foot and most likely from dependency. There is no tenderness still to compression of the distal fibula tibia. Maximal tenderness seems to be in the inferior  lateral malleolus and over the fibulotalar ligament. He also has some swelling and tenderness over the deltoid ligament medially. Subtalar joint is intact. He does have an antalgic gait.  Neurological: He is alert and oriented to person, place, and time.  Skin: Skin is warm and dry.  Psychiatric: He has a normal mood and affect. His behavior is normal. Judgment and thought content normal.  Nursing note and vitals reviewed.   ED Course  Procedures (including critical care time)  Labs Review Labs Reviewed - No data to display  Imaging Review Dg Ankle Complete Left  08/29/2015  CLINICAL DATA:  Acute left ankle pain after injury on farm. Initial encounter. EXAM: LEFT ANKLE COMPLETE - 3+ VIEW COMPARISON:  None. FINDINGS: There is no evidence of fracture, dislocation, or joint effusion. There is no evidence of arthropathy or other focal bone abnormality. Soft tissues are unremarkable. IMPRESSION: Normal left ankle. Electronically Signed   By: Lupita Raider, M.D.   On: 08/29/2015 17:42     Visual Acuity Review  Right Eye Distance:   Left Eye Distance:   Bilateral Distance:    Right Eye Near:   Left Eye Near:    Bilateral Near:     Left ankle stirup applied prior to his departure.   MDM   1. Left ankle sprain, initial encounter     Discharge Medication List as of 08/29/2015  6:00 PM    START taking these medications   Details  naproxen (NAPROSYN) 500 MG tablet Take 1 tablet (500 mg total) by mouth 2 (two) times daily., Starting 08/29/2015, Until Discontinued, Normal      Plan: 1. Test/x-ray results and diagnosis reviewed with patient 2. rx as per orders; risks, benefits, potential side effects reviewed with patient 3. Recommend supportive treatment with elevation ice as necessary. I provided him with a ankle stirrup for better support. I was given prescription for some Naprosyn but he must take it with food since he don't he has GERD. Is improving then he should be seen by an  orthopedic surgeon but otherwise he should progress as tolerated. 4. F/u prn if symptoms worsen or don't improve     Lutricia Feil, PA-C 08/29/15 1815

## 2015-08-29 NOTE — Discharge Instructions (Signed)
Ankle Sprain °An ankle sprain is an injury to the strong, fibrous tissues (ligaments) that hold the bones of your ankle joint together.  °CAUSES °An ankle sprain is usually caused by a fall or by twisting your ankle. Ankle sprains most commonly occur when you step on the outer edge of your foot, and your ankle turns inward. People who participate in sports are more prone to these types of injuries.  °SYMPTOMS  °· Pain in your ankle. The pain may be present at rest or only when you are trying to stand or walk. °· Swelling. °· Bruising. Bruising may develop immediately or within 1 to 2 days after your injury. °· Difficulty standing or walking, particularly when turning corners or changing directions. °DIAGNOSIS  °Your caregiver will ask you details about your injury and perform a physical exam of your ankle to determine if you have an ankle sprain. During the physical exam, your caregiver will press on and apply pressure to specific areas of your foot and ankle. Your caregiver will try to move your ankle in certain ways. An X-ray exam may be done to be sure a bone was not broken or a ligament did not separate from one of the bones in your ankle (avulsion fracture).  °TREATMENT  °Certain types of braces can help stabilize your ankle. Your caregiver can make a recommendation for this. Your caregiver may recommend the use of medicine for pain. If your sprain is severe, your caregiver may refer you to a surgeon who helps to restore function to parts of your skeletal system (orthopedist) or a physical therapist. °HOME CARE INSTRUCTIONS  °· Apply ice to your injury for 1-2 days or as directed by your caregiver. Applying ice helps to reduce inflammation and pain. °· Put ice in a plastic bag. °· Place a towel between your skin and the bag. °· Leave the ice on for 15-20 minutes at a time, every 2 hours while you are awake. °· Only take over-the-counter or prescription medicines for pain, discomfort, or fever as directed by  your caregiver. °· Elevate your injured ankle above the level of your heart as much as possible for 2-3 days. °· If your caregiver recommends crutches, use them as instructed. Gradually put weight on the affected ankle. Continue to use crutches or a cane until you can walk without feeling pain in your ankle. °· If you have a plaster splint, wear the splint as directed by your caregiver. Do not rest it on anything harder than a pillow for the first 24 hours. Do not put weight on it. Do not get it wet. You may take it off to take a shower or bath. °· You may have been given an elastic bandage to wear around your ankle to provide support. If the elastic bandage is too tight (you have numbness or tingling in your foot or your foot becomes cold and blue), adjust the bandage to make it comfortable. °· If you have an air splint, you may blow more air into it or let air out to make it more comfortable. You may take your splint off at night and before taking a shower or bath. Wiggle your toes in the splint several times per day to decrease swelling. °SEEK MEDICAL CARE IF:  °· You have rapidly increasing bruising or swelling. °· Your toes feel extremely cold or you lose feeling in your foot. °· Your pain is not relieved with medicine. °SEEK IMMEDIATE MEDICAL CARE IF: °· Your toes are numb or blue. °·   You have severe pain that is increasing. MAKE SURE YOU:   Understand these instructions.  Will watch your condition.  Will get help right away if you are not doing well or get worse.   This information is not intended to replace advice given to you by your health care provider. Make sure you discuss any questions you have with your health care provider.   Document Released: 07/15/2005 Document Revised: 08/05/2014 Document Reviewed: 07/27/2011 Elsevier Interactive Patient Education 2016 Elsevier Inc.  Stirrup Ankle Brace Stirrup ankle braces give support and help stabilize the ankle joint. They are rigid pieces of  plastic or fiberglass that go up both sides of the lower leg with the bottom of the stirrup fitting comfortably under the bottom of the instep of the foot. It can be held on with Velcro straps or an elastic wrap. Stirrup ankle braces are used to support the ankle following mild or moderate sprains or strains, or fractures after cast removal.  They can be easily removed or adjusted if there is swelling. The rigid brace shells are designed to fit the ankle comfortably and provide the needed medial/lateral stabilization. This brace can be easily worn with most athletic shoes. The brace liner is usually made of a soft, comfortable gel-like material. This gel fits the ankle well without causing uncomfortable pressure points.  IMPORTANCE OF ANKLE BRACES:  The use of ankle bracing is effective in the prevention of ankle sprains.  In athletes, the use of ankle bracing will offer protection and prevent further sprains.  Research shows that a complete rehabilitation program needs to be included with external bracing. This includes range of motion and ankle strengthening exercises. Your caregivers will instruct you in this. If you were given the brace today for a new injury, use the following home care instructions as a guide. HOME CARE INSTRUCTIONS   Apply ice to the sore area for 15-20 minutes, 03-04 times per day while awake for the first 2 days. Put the ice in a plastic bag and place a towel between the bag of ice and your skin. Never place the ice pack directly on your skin. Be especially careful using ice on an elbow or knee or other bony area, such as your ankle, because icing for too long may damage the nerves which are close to the surface.  Keep your leg elevated when possible to lessen swelling.  Wear your splint until you are seen for a follow-up examination. Do not put weight on it. Do not get it wet. You may take it off to take a shower or bath.  For Activity: Use crutches with non-weight  bearing for 1 week. Then, you may walk on your ankle as instructed. Start gradually with weight bearing on the affected ankle.  Continue to use crutches or a cane until you can stand on your ankle without causing pain.  Wiggle your toes in the splint several times per day if you are able.  The splint is too tight if you have numbness, tingling, or if your foot becomes cold and blue. Adjust the straps or elastic bandage to make it comfortable.  Only take over-the-counter or prescription medicines for pain, discomfort, or fever as directed by your caregiver. SEEK IMMEDIATE MEDICAL CARE IF:   You have increased bruising, swelling or pain.  Your toes are blue or cold and loosening the brace or wrap does not help.  Your pain is not relieved with medicine. MAKE SURE YOU:   Understand these instructions.  Will watch your condition.  Will get help right away if you are not doing well or get worse.   This information is not intended to replace advice given to you by your health care provider. Make sure you discuss any questions you have with your health care provider.   Document Released: 05/15/2004 Document Revised: 10/07/2011 Document Reviewed: 02/14/2015 Elsevier Interactive Patient Education [employer]

## 2015-08-29 NOTE — ED Notes (Signed)
Pt presents with left ankle pain, states he twisted it on Saturday.

## 2016-01-25 ENCOUNTER — Encounter (HOSPITAL_BASED_OUTPATIENT_CLINIC_OR_DEPARTMENT_OTHER)
Admission: RE | Disposition: A | Discharge status: Home / Self Care | Payer: Self-pay | Source: Ambulatory Visit | Attending: Emergency Medicine

## 2016-01-25 ENCOUNTER — Encounter (HOSPITAL_COMMUNITY): Payer: Self-pay | Admitting: Emergency Medicine

## 2016-01-25 ENCOUNTER — Ambulatory Visit (HOSPITAL_COMMUNITY)
Admission: RE | Admit: 2016-01-25 | Discharge: 2016-01-25 | Disposition: A | Discharge status: Home / Self Care | Payer: BLUE CROSS/BLUE SHIELD | Source: Ambulatory Visit | Attending: Orthopedic Surgery | Admitting: Orthopedic Surgery

## 2016-01-25 ENCOUNTER — Emergency Department (HOSPITAL_COMMUNITY): Payer: BLUE CROSS/BLUE SHIELD

## 2016-01-25 ENCOUNTER — Ambulatory Visit (HOSPITAL_BASED_OUTPATIENT_CLINIC_OR_DEPARTMENT_OTHER): Admit: 2016-01-25 | Payer: Self-pay | Admitting: Orthopedic Surgery

## 2016-01-25 ENCOUNTER — Ambulatory Visit (HOSPITAL_BASED_OUTPATIENT_CLINIC_OR_DEPARTMENT_OTHER): Payer: BLUE CROSS/BLUE SHIELD | Admitting: Anesthesiology

## 2016-01-25 DIAGNOSIS — K219 Gastro-esophageal reflux disease without esophagitis: Secondary | ICD-10-CM | POA: Insufficient documentation

## 2016-01-25 DIAGNOSIS — S62634B Displaced fracture of distal phalanx of right ring finger, initial encounter for open fracture: Secondary | ICD-10-CM | POA: Insufficient documentation

## 2016-01-25 DIAGNOSIS — S62604B Fracture of unspecified phalanx of right ring finger, initial encounter for open fracture: Secondary | ICD-10-CM | POA: Diagnosis present

## 2016-01-25 DIAGNOSIS — W230XXA Caught, crushed, jammed, or pinched between moving objects, initial encounter: Secondary | ICD-10-CM | POA: Insufficient documentation

## 2016-01-25 DIAGNOSIS — I10 Essential (primary) hypertension: Secondary | ICD-10-CM | POA: Diagnosis not present

## 2016-01-25 HISTORY — PX: INCISION AND DRAINAGE: SHX5863

## 2016-01-25 SURGERY — INCISION AND DRAINAGE
Anesthesia: General | Site: Finger | Laterality: Right

## 2016-01-25 MED ORDER — BUPIVACAINE HCL (PF) 0.25 % IJ SOLN
INTRAMUSCULAR | Status: DC | PRN
  Administered 2016-01-25: 10 mL

## 2016-01-25 MED ORDER — BUPIVACAINE HCL (PF) 0.25 % IJ SOLN
INTRAMUSCULAR | Status: AC
  Filled 2016-01-25: qty 30

## 2016-01-25 MED ORDER — KETOROLAC TROMETHAMINE 30 MG/ML IJ SOLN
INTRAMUSCULAR | Status: DC | PRN
  Administered 2016-01-25: 30 mg via INTRAVENOUS

## 2016-01-25 MED ORDER — HYDROCODONE-ACETAMINOPHEN 5-325 MG PO TABS
2.0000 | ORAL_TABLET | Freq: Once | ORAL | Status: AC
  Administered 2016-01-25: 2 via ORAL
  Filled 2016-01-25: qty 2

## 2016-01-25 MED ORDER — FENTANYL CITRATE (PF) 100 MCG/2ML IJ SOLN: 25.0000 ug | INTRAMUSCULAR | Status: DC | PRN

## 2016-01-25 MED ORDER — MIDAZOLAM HCL 2 MG/2ML IJ SOLN
INTRAMUSCULAR | Status: AC
  Filled 2016-01-25: qty 2

## 2016-01-25 MED ORDER — FENTANYL CITRATE (PF) 100 MCG/2ML IJ SOLN
INTRAMUSCULAR | Status: DC | PRN
  Administered 2016-01-25 (×2): 50 ug via INTRAVENOUS

## 2016-01-25 MED ORDER — PROMETHAZINE HCL 25 MG/ML IJ SOLN: 6.2500 mg | INTRAMUSCULAR | Status: DC | PRN

## 2016-01-25 MED ORDER — CHLORHEXIDINE GLUCONATE 4 % EX LIQD: 60.0000 mL | Freq: Once | CUTANEOUS | Status: DC

## 2016-01-25 MED ORDER — CEFAZOLIN SODIUM 10 G IJ SOLR
3.0000 g | INTRAMUSCULAR | Status: AC
  Administered 2016-01-25: 3 g via INTRAVENOUS

## 2016-01-25 MED ORDER — LIDOCAINE 2% (20 MG/ML) 5 ML SYRINGE
INTRAMUSCULAR | Status: AC
  Filled 2016-01-25: qty 5

## 2016-01-25 MED ORDER — FENTANYL CITRATE (PF) 100 MCG/2ML IJ SOLN
50.0000 ug | INTRAMUSCULAR | Status: DC | PRN
  Administered 2016-01-25: 50 ug via INTRAVENOUS
  Administered 2016-01-25: 100 ug via INTRAVENOUS

## 2016-01-25 MED ORDER — SULFAMETHOXAZOLE-TRIMETHOPRIM 800-160 MG PO TABS: 1.0000 | ORAL_TABLET | Freq: Two times a day (BID) | ORAL | Status: DC

## 2016-01-25 MED ORDER — HYDROCODONE-ACETAMINOPHEN 5-325 MG PO TABS: ORAL_TABLET | ORAL | Status: DC

## 2016-01-25 MED ORDER — MIDAZOLAM HCL 5 MG/5ML IJ SOLN
INTRAMUSCULAR | Status: DC | PRN
  Administered 2016-01-25: 2 mg via INTRAVENOUS

## 2016-01-25 MED ORDER — FENTANYL CITRATE (PF) 100 MCG/2ML IJ SOLN: 50.0000 ug | INTRAMUSCULAR | Status: DC | PRN

## 2016-01-25 MED ORDER — LACTATED RINGERS IV SOLN
INTRAVENOUS | Status: DC
  Administered 2016-01-25 (×2): via INTRAVENOUS

## 2016-01-25 MED ORDER — DEXAMETHASONE SODIUM PHOSPHATE 4 MG/ML IJ SOLN
INTRAMUSCULAR | Status: DC | PRN
  Administered 2016-01-25: 10 mg via INTRAVENOUS

## 2016-01-25 MED ORDER — EPHEDRINE 5 MG/ML INJ
INTRAVENOUS | Status: AC
  Filled 2016-01-25: qty 10

## 2016-01-25 MED ORDER — KETOROLAC TROMETHAMINE 30 MG/ML IJ SOLN
INTRAMUSCULAR | Status: AC
  Filled 2016-01-25: qty 1

## 2016-01-25 MED ORDER — PROPOFOL 10 MG/ML IV BOLUS
INTRAVENOUS | Status: DC | PRN
  Administered 2016-01-25: 200 mg via INTRAVENOUS
  Administered 2016-01-25: 100 mg via INTRAVENOUS

## 2016-01-25 MED ORDER — LIDOCAINE HCL (CARDIAC) 20 MG/ML IV SOLN
INTRAVENOUS | Status: DC | PRN
  Administered 2016-01-25: 30 mg via INTRAVENOUS

## 2016-01-25 MED ORDER — ONDANSETRON HCL 4 MG/2ML IJ SOLN
INTRAMUSCULAR | Status: AC
  Filled 2016-01-25: qty 2

## 2016-01-25 MED ORDER — FENTANYL CITRATE (PF) 100 MCG/2ML IJ SOLN
INTRAMUSCULAR | Status: AC
  Filled 2016-01-25: qty 2

## 2016-01-25 MED ORDER — ONDANSETRON HCL 4 MG/2ML IJ SOLN
INTRAMUSCULAR | Status: DC | PRN
  Administered 2016-01-25: 4 mg via INTRAVENOUS

## 2016-01-25 MED ORDER — DEXAMETHASONE SODIUM PHOSPHATE 10 MG/ML IJ SOLN
INTRAMUSCULAR | Status: AC
  Filled 2016-01-25: qty 1

## 2016-01-25 MED ORDER — METOCLOPRAMIDE HCL 5 MG/ML IJ SOLN
INTRAMUSCULAR | Status: AC
  Filled 2016-01-25: qty 2

## 2016-01-25 MED ORDER — TETANUS-DIPHTH-ACELL PERTUSSIS 5-2.5-18.5 LF-MCG/0.5 IM SUSP
0.5000 mL | Freq: Once | INTRAMUSCULAR | Status: AC
  Administered 2016-01-25: 0.5 mL via INTRAMUSCULAR
  Filled 2016-01-25: qty 0.5

## 2016-01-25 MED ORDER — METOCLOPRAMIDE HCL 5 MG/ML IJ SOLN
INTRAMUSCULAR | Status: DC | PRN
  Administered 2016-01-25: 10 mg via INTRAVENOUS

## 2016-01-25 MED ORDER — EPHEDRINE SULFATE 50 MG/ML IJ SOLN
INTRAMUSCULAR | Status: DC | PRN
Start: 2016-01-25 — End: 2016-01-25
  Administered 2016-01-25: 10 mg via INTRAVENOUS

## 2016-01-25 MED ORDER — PROPOFOL 10 MG/ML IV BOLUS
INTRAVENOUS | Status: AC
  Filled 2016-01-25: qty 40

## 2016-01-25 SURGICAL SUPPLY — 49 items
BAG DECANTER FOR FLEXI CONT (MISCELLANEOUS) ×2 IMPLANT
BANDAGE ACE 3X5.8 VEL STRL LF (GAUZE/BANDAGES/DRESSINGS) IMPLANT
BLADE MINI RND TIP GREEN BEAV (BLADE) IMPLANT
BLADE SURG 15 STRL LF DISP TIS (BLADE) ×2 IMPLANT
BLADE SURG 15 STRL SS (BLADE) ×6
BNDG CMPR 9X4 STRL LF SNTH (GAUZE/BANDAGES/DRESSINGS) ×1
BNDG COHESIVE 1X5 TAN STRL LF (GAUZE/BANDAGES/DRESSINGS) ×2 IMPLANT
BNDG ELASTIC 2X5.8 VLCR STR LF (GAUZE/BANDAGES/DRESSINGS) IMPLANT
BNDG ESMARK 4X9 LF (GAUZE/BANDAGES/DRESSINGS) ×2 IMPLANT
BNDG GAUZE 1X2.1 STRL (MISCELLANEOUS) IMPLANT
BNDG GAUZE ELAST 4 BULKY (GAUZE/BANDAGES/DRESSINGS) IMPLANT
CHLORAPREP W/TINT 26ML (MISCELLANEOUS) ×3 IMPLANT
CORDS BIPOLAR (ELECTRODE) ×3 IMPLANT
COVER BACK TABLE 60X90IN (DRAPES) ×3 IMPLANT
COVER MAYO STAND STRL (DRAPES) ×3 IMPLANT
CUFF TOURNIQUET SINGLE 18IN (TOURNIQUET CUFF) ×3 IMPLANT
DRAPE EXTREMITY T 121X128X90 (DRAPE) ×3 IMPLANT
DRAPE SURG 17X23 STRL (DRAPES) ×3 IMPLANT
GAUZE PACKING IODOFORM 1/4X15 (GAUZE/BANDAGES/DRESSINGS) IMPLANT
GAUZE SPONGE 4X4 12PLY STRL (GAUZE/BANDAGES/DRESSINGS) ×3 IMPLANT
GAUZE XEROFORM 1X8 LF (GAUZE/BANDAGES/DRESSINGS) ×3 IMPLANT
GLOVE BIO SURGEON STRL SZ7.5 (GLOVE) ×3 IMPLANT
GLOVE BIOGEL PI IND STRL 8 (GLOVE) ×1 IMPLANT
GLOVE BIOGEL PI INDICATOR 8 (GLOVE) ×2
GOWN STRL REUS W/ TWL LRG LVL3 (GOWN DISPOSABLE) ×1 IMPLANT
GOWN STRL REUS W/TWL LRG LVL3 (GOWN DISPOSABLE) ×3
GOWN STRL REUS W/TWL XL LVL3 (GOWN DISPOSABLE) ×3 IMPLANT
LOOP VESSEL MAXI BLUE (MISCELLANEOUS) IMPLANT
NDL HYPO 25X1 1.5 SAFETY (NEEDLE) IMPLANT
NEEDLE HYPO 25X1 1.5 SAFETY (NEEDLE) IMPLANT
NS IRRIG 1000ML POUR BTL (IV SOLUTION) ×3 IMPLANT
PACK BASIN DAY SURGERY FS (CUSTOM PROCEDURE TRAY) ×3 IMPLANT
PAD CAST 3X4 CTTN HI CHSV (CAST SUPPLIES) IMPLANT
PADDING CAST ABS 4INX4YD NS (CAST SUPPLIES) ×2
PADDING CAST ABS COTTON 4X4 ST (CAST SUPPLIES) ×1 IMPLANT
PADDING CAST COTTON 3X4 STRL (CAST SUPPLIES)
SPLINT FINGER 3.25 911903 (SOFTGOODS) ×2 IMPLANT
SPLINT PLASTER CAST XFAST 3X15 (CAST SUPPLIES) IMPLANT
SPLINT PLASTER XTRA FASTSET 3X (CAST SUPPLIES)
STOCKINETTE 4X48 STRL (DRAPES) ×3 IMPLANT
SUT ETHILON 4 0 PS 2 18 (SUTURE) IMPLANT
SWAB COLLECTION DEVICE MRSA (MISCELLANEOUS) IMPLANT
SWAB CULTURE ESWAB REG 1ML (MISCELLANEOUS) IMPLANT
SYR 20CC LL (SYRINGE) IMPLANT
SYR BULB 3OZ (MISCELLANEOUS) ×3 IMPLANT
SYR CONTROL 10ML LL (SYRINGE) IMPLANT
TOWEL OR 17X24 6PK STRL BLUE (TOWEL DISPOSABLE) ×4 IMPLANT
TUBE FEEDING 5FR 15 INCH (TUBING) IMPLANT
UNDERPAD 30X30 (UNDERPADS AND DIAPERS) ×3 IMPLANT

## 2016-01-25 NOTE — Progress Notes (Signed)
Pt C/O pain right ring finger. Dr Council Mechanicenenny notified Pain med ordered and given

## 2016-01-25 NOTE — H&P (Signed)
Eddie Daniels is an 35 y.o. male.   Chief Complaint: right ring finger crush injury with open distal phalanx fracture HPI: 35 yo rhd male states he smashed right ring finger in tailgate of dump truck.  [facility] distal phalanx fracture which was noted to be open.  He reports no previous injury to the finger and no other injuries at this time.  Throbbing pain of 6/10 severity.  Alleviated with elevation and worse with dependent position or palpation.  Injury occurred ~900 AM.  Case discussed with Eddie Horsemanobert Browning, PA-C and his note from 01/25/2016 reviewed. Xrays viewed and interpreted by me: 3 views right ring finger show distal phalanx transverse metaphyseal fracture with displacement. Labs reviewed: none  Allergies:  Allergies  Allergen Reactions  . Proton Pump Inhibitors Other (See Comments)    Throat swells    Past Medical History  Diagnosis Date  . GERD (gastroesophageal reflux disease)   . Hypertension     questionable.  said BP has been up when checked but has not seen PCP about it  . Snores     Past Surgical History  Procedure Laterality Date  . Cholecystectomy    . Left hand surgery      Broken at age 35  . Colonoscopy with propofol N/A 07/13/2015    Procedure: COLONOSCOPY WITH PROPOFOL;  Surgeon: Eddie Miniumarren Wohl, MD;  [facility];  Service: Endoscopy;  Laterality: N/A;  . Esophagogastroduodenoscopy (egd) with propofol N/A 07/13/2015    Procedure: ESOPHAGOGASTRODUODENOSCOPY (EGD) WITH PROPOFOL;  Surgeon: Eddie Miniumarren Wohl, MD;  [facility];  Service: Endoscopy;  Laterality: N/A;    Family History: Family History  Problem Relation Age of Onset  . Diverticulitis Mother   . Stroke Father   . Heart disease Father     Social History:   reports that he has never smoked. He has never used smokeless tobacco. He reports that he does not drink alcohol or use illicit drugs.  Medications: Medications Prior to Admission   Medication Sig Dispense Refill  . dicyclomine (BENTYL) 20 MG tablet Take 1 tablet (20 mg total) by mouth 2 (two) times daily. 20 tablet 0  . naproxen (NAPROSYN) 500 MG tablet Take 1 tablet (500 mg total) by mouth 2 (two) times daily. 30 tablet 0  . ondansetron (ZOFRAN ODT) 4 MG disintegrating tablet Take 1 tablet (4 mg total) by mouth every 8 (eight) hours as needed for nausea or vomiting. (Patient not taking: Reported on 07/05/2015) 20 tablet 0  . ranitidine (ZANTAC) 150 MG tablet Take 1 tablet (150 mg total) by mouth 2 (two) times daily. 60 tablet 3    No results found for this or any previous visit (from the past 48 hour(s)).  Dg Finger Ring Right  01/25/2016  CLINICAL DATA:  Injury to fourth digit RIGHT hand after getting caught in dump truck tail gate, crush injury, deformity and laceration, initial encounter EXAM: RIGHT RING FINGER 2+V COMPARISON:  None FINDINGS: Transverse displaced fracture through distal phalanx RIGHT index finger with apex dorsal angulation. No articular extension. No additional fracture, dislocation or bone destruction. IMPRESSION: Displaced angulated fracture through proximal aspect of distal phalanx RIGHT ring finger. Electronically Signed   By: Eddie SouthwardMark  Daniels M.D.   On: 01/25/2016 11:05     A comprehensive review of systems was negative. Review of Systems: No fevers, chills, night sweats, chest pain, shortness of breath, nausea, vomiting, diarrhea, constipation, easy bleeding or bruising, headaches, dizziness, vision changes, fainting.  Blood pressure 136/90, pulse 61, temperature 98.4 F (36.9 C), temperature source Oral, resp. rate 16, SpO2 100 %.  General appearance: alert, cooperative and appears stated age Head: Normocephalic, without obvious abnormality, atraumatic Neck: supple, symmetrical, trachea midline Resp: clear to auscultation bilaterally Cardio: regular rate and rhythm GI: non-tender Extremities: Intact sensation and capillary refill all digits.   +epl/fpl/io.  Open fracture distal phalanx of distal phalanx of right ring finger. Pulses: 2+ and symmetric Skin: Skin color, texture, turgor normal. No rashes or lesions Neurologic: Grossly normal Incision/Wound: As above  Assessment/Plan Right ring finger open distal phalanx fracture with nail bed injury.  Non operative and operative treatment options were discussed with the patient and patient wishes to proceed with operative treatment. Recommend OR for irrigation and debridement, open reduction and fixation, repair nail bed.  Risks, benefits, and alternatives of surgery were discussed and the patient agrees with the plan of care.   Eddie Daniels R 01/25/2016, 1:21 PM

## 2016-01-25 NOTE — Brief Op Note (Signed)
01/25/2016  3:27 PM  PATIENT:  Eddie Daniels  35 y.o. male  PRE-OPERATIVE DIAGNOSIS:  right ring open distal phalanx fracture, nail bed injury  POST-OPERATIVE DIAGNOSIS:  right ring  open distal phalanx fracture, nail bed injury  PROCEDURE:  Procedure(s) with comments: INCISION AND DRAINAGE (Right) - right ring I&D and pinning/repair nail bed   SURGEON:  Surgeon(s) and Role:    * Betha LoaKevin Jayland Null, MD - Primary  PHYSICIAN ASSISTANT:   ASSISTANTS: none   ANESTHESIA:   general  EBL:  Total I/O In: 1200 [I.V.:1200] Out: 3 [Blood:3]  BLOOD ADMINISTERED:none  DRAINS: none   LOCAL MEDICATIONS USED:  MARCAINE     SPECIMEN:  No Specimen  DISPOSITION OF SPECIMEN:  N/A  COUNTS:  YES  TOURNIQUET:   Total Tourniquet Time Documented: Upper Arm (Right) - 32 minutes Total: Upper Arm (Right) - 32 minutes   DICTATION: .Other Dictation: Dictation Number [id number]  PLAN OF CARE: Discharge to home after PACU  PATIENT DISPOSITION:  PACU - hemodynamically stable.

## 2016-01-25 NOTE — Transfer of Care (Signed)
Immediate Anesthesia Transfer of Care Note  Patient: Eddie LassoKory D Arena  Procedure(s) Performed: Procedure(s) with comments: INCISION AND DRAINAGE (Right) - right ring I&D and pinning/repair nail bed   Patient Location: PACU  Anesthesia Type:General  Level of Consciousness: awake and sedated  Airway & Oxygen Therapy: Patient Spontanous Breathing and Patient connected to face mask oxygen  Post-op Assessment: Report given to RN and Post -op Vital signs reviewed and stable  Post vital signs: Reviewed and stable  Last Vitals:  Filed Vitals:   01/25/16 1415 01/25/16 1420  BP: 134/90   Pulse: 73 58  Temp:    Resp: 16 14    Last Pain:  Filed Vitals:   01/25/16 1424  PainSc: 5       Patients Stated Pain Goal: 6 (01/25/16 1345)  Complications: No apparent anesthesia complications

## 2016-01-25 NOTE — Op Note (Signed)
[id number] 

## 2016-01-25 NOTE — ED Provider Notes (Signed)
CSN: [id number]     Arrival date & time 01/25/16  0957 History  By signing my name below, I, Doreatha Martinva Mathews, attest that this documentation has been prepared under the direction and in the presence of Roxy Horsemanobert Ander Wamser, PA-C. Electronically Signed: Doreatha MartinEva Mathews, ED Scribe. 01/25/2016. 11:27 AM .    Chief Complaint  Patient presents with  . Finger Injury   The history is provided by the patient. No language interpreter was used.    HPI Comments: Eddie Daniels is a 35 y.o. male who presents to the Emergency Department with a chief complaint of a laceration with controlled bleeding to the right 4th finger s/p crush injury that occurred just PTA. Per pt, he was adjusting the tail gate of a dump truck when his finger was crushed, causing his injury. Pt reports associated pain and swelling to the finger, decreased ROM secondary to swelling. Bleeding controlled with pressure applied PTA. Pt denies taking OTC medications at home to improve symptoms. He also denies numbness, weakness, additional injuries.   Past Medical History  Diagnosis Date  . GERD (gastroesophageal reflux disease)   . Hypertension     questionable.  said BP has been up when checked but has not seen PCP about it  . Snores    Past Surgical History  Procedure Laterality Date  . Cholecystectomy    . Left hand surgery      Broken at age 35  . Colonoscopy with propofol N/A 07/13/2015    Procedure: COLONOSCOPY WITH PROPOFOL;  Surgeon: Midge Miniumarren Wohl, MD;  [facility];  Service: Endoscopy;  Laterality: N/A;  . Esophagogastroduodenoscopy (egd) with propofol N/A 07/13/2015    Procedure: ESOPHAGOGASTRODUODENOSCOPY (EGD) WITH PROPOFOL;  Surgeon: Midge Miniumarren Wohl, MD;  [facility];  Service: Endoscopy;  Laterality: N/A;   Family History  Problem Relation Age of Onset  . Diverticulitis Mother   . Stroke Father   . Heart disease Father    Social History  Substance Use Topics  . Smoking status: Never Smoker    . Smokeless tobacco: Never Used  . Alcohol Use: No    Review of Systems  Musculoskeletal: Positive for joint swelling and arthralgias.  Skin: Positive for wound.  Neurological: Negative for weakness and numbness.   Allergies  Proton pump inhibitors  Home Medications   Prior to Admission medications   Medication Sig Start Date End Date Taking? Authorizing Provider  dicyclomine (BENTYL) 20 MG tablet Take 1 tablet (20 mg total) by mouth 2 (two) times daily. 07/05/15   Midge Miniumarren Wohl, MD  naproxen (NAPROSYN) 500 MG tablet Take 1 tablet (500 mg total) by mouth 2 (two) times daily. 08/29/15   Lutricia FeilWilliam P Roemer, PA-C  ondansetron (ZOFRAN ODT) 4 MG disintegrating tablet Take 1 tablet (4 mg total) by mouth every 8 (eight) hours as needed for nausea or vomiting. Patient not taking: Reported on 07/05/2015 06/23/15   Alvira MondayErin Schlossman, MD  ranitidine (ZANTAC) 150 MG tablet Take 1 tablet (150 mg total) by mouth 2 (two) times daily. 07/05/15   Midge Miniumarren Wohl, MD   BP 142/99 mmHg  Pulse 75  Temp(Src) 98.4 F (36.9 C) (Oral)  Resp 18  SpO2 99% Physical Exam Physical Exam  Constitutional: Pt appears well-developed and well-nourished. No distress.  HENT:  Head: Normocephalic and atraumatic.  Eyes: Conjunctivae are normal.  Neck: Normal range of motion.  Cardiovascular: Normal rate, regular rhythm and intact distal pulses.   Capillary refill < 3 sec  Pulmonary/Chest: Effort normal and breath  sounds normal.  Musculoskeletal: Open fracture to the distal phalanx of the right 4th finger, bleeding controlled.  ROM: limited secondary to pain  Neurological: Pt  is alert. Coordination normal.  Sensation: intact  Strength limited secondary to pain  Skin: Skin is warm and dry. Pt is not diaphoretic.  No tenting of the skin  Psychiatric: Pt has a normal mood and affect.  Nursing note and vitals reviewed.   ED Course  Procedures (including critical care time) DIAGNOSTIC STUDIES: Oxygen Saturation is 99% on  RA, normal by my interpretation.    COORDINATION OF CARE: 10:10 AM Discussed treatment plan with pt at bedside which includes XR, hand surgery consult and pt agreed to plan.    Imaging Review Dg Finger Ring Right  01/25/2016  CLINICAL DATA:  Injury to fourth digit RIGHT hand after getting caught in dump truck tail gate, crush injury, deformity and laceration, initial encounter EXAM: RIGHT RING FINGER 2+V COMPARISON:  None FINDINGS: Transverse displaced fracture through distal phalanx RIGHT index finger with apex dorsal angulation. No articular extension. No additional fracture, dislocation or bone destruction. IMPRESSION: Displaced angulated fracture through proximal aspect of distal phalanx RIGHT ring finger. Electronically Signed   By: Ulyses SouthwardMark  Boles M.D.   On: 01/25/2016 11:05   I have personally reviewed and evaluated these images as part of my medical decision-making.   11:40 AM Consulted with hand surgeon, Dr. Merlyn LotKuzma, who will evaluate the pt.    MDM   Final diagnoses:  Open fracture of phalanx of right ring finger, initial encounter    Patient with open fracture of right ring finger.  Discussed with Dr. Merlyn LotKuzma.  Tx to outpatient surgical center now for definitive care.   I personally performed the services described in this documentation, which was scribed in my presence. The recorded information has been reviewed and is accurate.     Roxy Horsemanobert Elvia Aydin, PA-C 01/25/16 1201  Marily MemosJason Mesner, MD 01/26/16 [phone]

## 2016-01-25 NOTE — Anesthesia Preprocedure Evaluation (Signed)
Anesthesia Evaluation  Patient identified by MRN, date of birth, ID band Patient awake    Reviewed: Allergy & Precautions, NPO status , Patient's Chart, lab work & pertinent test results  Airway Mallampati: II  TM Distance: >3 FB Neck ROM: Full    Dental no notable dental hx.    Pulmonary neg pulmonary ROS,    Pulmonary exam normal breath sounds clear to auscultation       Cardiovascular hypertension, Normal cardiovascular exam Rhythm:Regular Rate:Normal     Neuro/Psych negative neurological ROS  negative psych ROS   GI/Hepatic Neg liver ROS, GERD  Medicated,  Endo/Other  negative endocrine ROS  Renal/GU negative Renal ROS  negative genitourinary   Musculoskeletal negative musculoskeletal ROS (+)   Abdominal (+) + obese,   Peds negative pediatric ROS (+)  Hematology negative hematology ROS (+)   Anesthesia Other Findings   Reproductive/Obstetrics negative OB ROS                             Anesthesia Physical Anesthesia Plan  ASA: II  Anesthesia Plan: General   Post-op Pain Management:    Induction: Intravenous  Airway Management Planned: LMA  Additional Equipment:   Intra-op Plan:   Post-operative Plan: Extubation in OR  Informed Consent: I have reviewed the patients History and Physical, chart, labs and discussed the procedure including the risks, benefits and alternatives for the proposed anesthesia with the patient or authorized representative who has indicated his/her understanding and acceptance.   Dental advisory given  Plan Discussed with: CRNA  Anesthesia Plan Comments:         Anesthesia Quick Evaluation

## 2016-01-25 NOTE — ED Notes (Signed)
Pt here with injury to 4th digit on right hand after getting caught in dump truck tail gate; deformity noted to end of finger and laceration noted; bleeding controlled

## 2016-01-25 NOTE — Anesthesia Postprocedure Evaluation (Signed)
Anesthesia Post Note  Patient: Eddie Daniels  Procedure(s) Performed: Procedure(s) (LRB): INCISION AND DRAINAGE (Right)  Patient location during evaluation: PACU Anesthesia Type: General Level of consciousness: awake and alert Pain management: pain level controlled Vital Signs Assessment: post-procedure vital signs reviewed and stable Respiratory status: spontaneous breathing, nonlabored ventilation, respiratory function stable and patient connected to nasal cannula oxygen Cardiovascular status: blood pressure returned to baseline and stable Postop Assessment: no signs of nausea or vomiting Anesthetic complications: no    Last Vitals:  Filed Vitals:   01/25/16 1600 01/25/16 1630  BP: 128/89 146/91  Pulse: 84 65  Temp:  36.4 C  Resp: 14 16    Last Pain:  Filed Vitals:   01/25/16 1631  PainSc: 0-No pain                 Rhythm Wigfall J

## 2016-01-25 NOTE — Discharge Instructions (Signed)
Finger Fracture Fractures of fingers are breaks in the bones of the fingers. There are many types of fractures. There are different ways of treating these fractures. Your health care provider will discuss the best way to treat your fracture. CAUSES Traumatic injury is the main cause of broken fingers. These include:  Injuries while playing sports.  Workplace injuries.  Falls. RISK FACTORS Activities that can increase your risk of finger fractures include:  Sports.  Workplace activities that involve machinery.  A condition called osteoporosis, which can make your bones less dense and cause them to fracture more easily. SIGNS AND SYMPTOMS The main symptoms of a broken finger are pain and swelling within 15 minutes after the injury. Other symptoms include:  Bruising of your finger.  Stiffness of your finger.  Numbness of your finger.  Exposed bones (compound fracture) if the fracture is severe. DIAGNOSIS  The best way to diagnose a broken bone is with X-ray imaging. Additionally, your health care provider will use this X-ray image to evaluate the position of the broken finger bones.  TREATMENT  Finger fractures can be treated with:   Nonreduction--This means the bones are in place. The finger is splinted without changing the positions of the bone pieces. The splint is usually left on for about a week to 10 days. This will depend on your fracture and what your health care provider thinks.  Closed reduction--The bones are put back into position without using surgery. The finger is then splinted.  Open reduction and internal fixation--The fracture site is opened. Then the bone pieces are fixed into place with pins or some type of hardware. This is seldom required. It depends on the severity of the fracture. HOME CARE INSTRUCTIONS   Follow your health care provider's instructions regarding activities, exercises, and physical therapy.  Only take over-the-counter or prescription  medicines for pain, discomfort, or fever as directed by your health care provider. SEEK MEDICAL CARE IF: You have pain or swelling that limits the motion or use of your fingers. SEEK IMMEDIATE MEDICAL CARE IF:  Your finger becomes numb. MAKE SURE YOU:   Understand these instructions.  Will watch your condition.  Will get help right away if you are not doing well or get worse.   This information is not intended to replace advice given to you by your health care provider. Make sure you discuss any questions you have with your health care provider.   Document Released: 10/27/2000 Document Revised: 05/05/2013 Document Reviewed: 02/24/2013 Elsevier Interactive Patient Education 2016 [employer]   Post Anesthesia Home Care Instructions  Activity: Get plenty of rest for the remainder of the day. A responsible adult should stay with you for 24 hours following the procedure.  For the next 24 hours, DO NOT: -Drive a car -Advertising copywriterperate machinery -Drink alcoholic beverages -Take any medication unless instructed by your physician -Make any legal decisions or sign important papers.  Meals: Start with liquid foods such as gelatin or soup. Progress to regular foods as tolerated. Avoid greasy, spicy, heavy foods. If nausea and/or vomiting occur, drink only clear liquids until the nausea and/or vomiting subsides. Call your physician if vomiting continues.  Special Instructions/Symptoms: Your throat may feel dry or sore from the anesthesia or the breathing tube placed in your throat during surgery. If this causes discomfort, gargle with warm salt water. The discomfort should disappear within 24 hours.  If you had a scopolamine patch placed behind your ear for the management of post- operative nausea and/or vomiting:  1. The medication in the patch is effective for 72 hours, after which it should be removed.  Wrap patch in a tissue and discard in the trash. Wash hands thoroughly with soap and  water. 2. You may remove the patch earlier than 72 hours if you experience unpleasant side effects which may include dry mouth, dizziness or visual disturbances. 3. Avoid touching the patch. Wash your hands with soap and water after contact with the patch.    [facility] Instructions Hand Surgery  Wound Care: Keep your hand elevated above the level of your heart.  Do not allow it to dangle by your side.  Keep the dressing dry and do not remove it unless your doctor advises you to do so.  He will usually change it at the time of your post-op visit.  Moving your fingers is advised to stimulate circulation but will depend on the site of your surgery.  If you have a splint applied, your doctor will advise you regarding movement.  Activity: Do not drive or operate machinery today.  Rest today and then you may return to your normal activity and work as indicated by your physician.  Diet:  Drink liquids today or eat a light diet.  You may resume a regular diet tomorrow.    General expectations: Pain for two to three days. Fingers may become slightly swollen.  Call your doctor if any of the following occur: Severe pain not relieved by pain medication. Elevated temperature. Dressing soaked with blood. Inability to move fingers. White or bluish color to fingers.

## 2016-01-26 ENCOUNTER — Encounter (HOSPITAL_BASED_OUTPATIENT_CLINIC_OR_DEPARTMENT_OTHER): Payer: Self-pay | Admitting: Orthopedic Surgery

## 2016-01-26 NOTE — Op Note (Signed)
NAMEmeterio Daniels:  Eddie Daniels, Eddie Daniels                ACCOUNT NO.:  [id number]  MEDICAL RECORD NO.:  [id number]  LOCATION:  [facility]                         FACILITY:  [facility]  PHYSICIAN:  Betha LoaKevin Thersea Manfredonia, MD        DATE OF BIRTH:  [birth year]  DATE OF PROCEDURE:  01/25/2016 DATE OF DISCHARGE:                              OPERATIVE REPORT   PREOPERATIVE DIAGNOSIS:  Right ring fingertip crush injury with open distal phalanx fracture and nail bed injury.  POSTOPERATIVE DIAGNOSIS:  Right ring fingertip crush injury with open distal phalanx fracture and nail bed injury.  PROCEDURES:   1. Right ring finger irrigation and debridement of open distal phalanx fracture.   2. Open reduction and percutaneous pinning of right ring finger open distal phalanx fracture 3. Right ring finger repair of nail bed laceration.  SURGEON:  Betha LoaKevin Lynleigh Kovack, MD  ASSISTANT:  None.  ANESTHESIA:  General.  IV FLUIDS:  Per anesthesia flow sheet.  ESTIMATED BLOOD LOSS:  Minimal.  COMPLICATIONS:  None.  SPECIMENS:  None.  TOURNIQUET TIME:  32 minutes.  DISPOSITION:  Stable to PACU.  INDICATIONS:  Mr. Iran OuchStrader is a 35 year old male who earlier today states he smashed his right ring finger in dump truck tailgate.  He was seen at the emergency department where radiographs were taken revealing distal phalanx fracture.  He was referred to me for further care.  I recommended operative irrigation and debridement of open fracture with pinning of fracture and repair of nail bed laceration.  Risks, benefits and alternatives of the surgery were discussed including the risk of blood loss; infection; damage to nerves, vessels, tendons, ligaments, bone; failure of surgery; need for additional surgery; complications with wound healing; continued pain; nonunion; malunion; stiffness and nail deformity.  He voiced understanding of these risks and elected to proceed.  OPERATIVE COURSE:  After being identified preoperatively by myself, the patient  and I agreed upon the procedure and site of procedure.  Surgical site was marked.  The risks, benefits and alternatives of the surgery were reviewed and he wished to proceed.  Surgical consent had been signed.  His tetanus had been updated in the emergency department.  He was given IV Ancef as preoperative antibiotic coverage.  He was transferred to the operating room and placed in the operating room table in supine position with the right upper extremity on an armboard. General anesthesia was induced by anesthesiologist.  Right upper extremity was prepped and draped in normal sterile orthopedic fashion. A surgical pause was performed between the surgeons, anesthesia and operating room staff, and all were in agreement as to the patient, procedure and site of procedure.  Tourniquet at the proximal aspect of the extremity was inflated to 250 mmHg after exsanguination of the limb with an Esmarch bandage.  The proximal aspect of the distal fragment of the distal phalanx was exposed.  This was copiously irrigated with sterile saline.  It was debrided of contaminated hematoma.  Two relaxing incisions were made in the dorsal nail fold on either side at a 45- degree angle.  The nail fold was elevated and the fracture was reduced under direct visualization.  It was cleared of any soft  tissue interposition.  Some contaminated hematomas were removed from the fracture site.  It was again copiously irrigated with sterile saline.  C- arm was used in AP and lateral projections to aid in reduction.  Near anatomic reduction was obtained.  Two 0.035-inch K-wires were advanced from the tip of the finger across the fracture site and one across the DIP joint for stabilization.  This provided good stabilization of the fracture.  C-arm was used in AP and lateral projections to ensure appropriate reduction and position of hardware, which was the case.  The nail bed was then repaired back into the nail fold using  horizontal mattress style sutures.  The two relaxing incisions were repaired with 6- 0 chromic suture as well.  There was an additional small laceration at the edge of the nail that was approximately 5-mm in length, that was repaired with 6-0 chromic suture as well.  A piece of Xeroform was placed in the nail fold and pin sites and wounds dressed with sterile Xeroform, 4 x 4 and wrapped with Coban dressing lightly.  An Alumafoam splint was placed and wrapped lightly with Coban dressing.  A digital block was performed.  A 10 mL of 0.25% plain Marcaine to aid in postoperative analgesia.  Tourniquet was deflated at 32 minutes.  The pins had been bent and cut short.  The patient was awakened from anesthesia safely.  He was transferred back to the stretcher and taken to PACU in stable condition.  I will see him back in the office in 1 week for postoperative followup.  I will give him Norco 5/325, 1-2 p.o. q.6 hours p.r.n. pain, dispensed #30 and Bactrim DS 1 p.o. b.i.d. x7 days.     Betha LoaKevin Adeana Grilliot, MD     KK/MEDQ  D:  01/25/2016  T:  01/26/2016  Job:  [id number]

## 2016-03-04 ENCOUNTER — Encounter (HOSPITAL_COMMUNITY): Payer: Self-pay | Admitting: *Deleted

## 2016-03-04 ENCOUNTER — Emergency Department (HOSPITAL_COMMUNITY)
Admission: EM | Admit: 2016-03-04 | Discharge: 2016-03-05 | Disposition: A | Discharge status: Home / Self Care | Payer: BLUE CROSS/BLUE SHIELD | Attending: Emergency Medicine | Admitting: Emergency Medicine

## 2016-03-04 ENCOUNTER — Emergency Department (HOSPITAL_COMMUNITY)
Admission: EM | Admit: 2016-03-04 | Discharge: 2016-03-04 | Disposition: A | Discharge status: Home / Self Care | Payer: BLUE CROSS/BLUE SHIELD

## 2016-03-04 DIAGNOSIS — R1114 Bilious vomiting: Secondary | ICD-10-CM | POA: Diagnosis not present

## 2016-03-04 DIAGNOSIS — R1013 Epigastric pain: Secondary | ICD-10-CM

## 2016-03-04 DIAGNOSIS — I10 Essential (primary) hypertension: Secondary | ICD-10-CM | POA: Insufficient documentation

## 2016-03-04 DIAGNOSIS — R110 Nausea: Secondary | ICD-10-CM

## 2016-03-04 LAB — COMPREHENSIVE METABOLIC PANEL
ALBUMIN: 4.6 g/dL (ref 3.5–5.0)
ALT: 42 U/L (ref 17–63)
ANION GAP: 11 (ref 5–15)
AST: 37 U/L (ref 15–41)
Alkaline Phosphatase: 72 U/L (ref 38–126)
BUN: 18 mg/dL (ref 6–20)
CHLORIDE: 107 mmol/L (ref 101–111)
CO2: 18 mmol/L — ABNORMAL LOW (ref 22–32)
Calcium: 9.1 mg/dL (ref 8.9–10.3)
Creatinine, Ser: 1.01 mg/dL (ref 0.61–1.24)
GFR calc Af Amer: >60 mL/min (ref >60)
GFR calc non Af Amer: >60 mL/min (ref >60)
GLUCOSE: 124 mg/dL — AB (ref 65–99)
POTASSIUM: 4.4 mmol/L (ref 3.5–5.1)
SODIUM: 136 mmol/L (ref 135–145)
Total Bilirubin: 1 mg/dL (ref 0.3–1.2)
Total Protein: 7.7 g/dL (ref 6.5–8.1)

## 2016-03-04 LAB — LIPASE, BLOOD: LIPASE: 31 U/L (ref 11–51)

## 2016-03-04 MED ORDER — ONDANSETRON HCL 4 MG/2ML IJ SOLN
4.0000 mg | Freq: Once | INTRAMUSCULAR | Status: AC | PRN
  Administered 2016-03-04: 4 mg via INTRAVENOUS
  Filled 2016-03-04: qty 2

## 2016-03-04 NOTE — ED Notes (Signed)
Patient vomited x1 while Clinical research associatewriter in room. Just after zofran iv was given.

## 2016-03-04 NOTE — ED Triage Notes (Signed)
Pt states that he began having abd pain, N/V that began around 5:30pm; pt states that he has vomited more than 10 times; pt describes the abd pain as burning; pt vomiting upon arrival to triage; pt diaphoretic; pt denies diarrhea

## 2016-03-04 NOTE — ED Notes (Signed)
Pt left with visitor. 

## 2016-03-04 NOTE — ED Notes (Signed)
Patient aware that urine sample is needed.

## 2016-03-05 ENCOUNTER — Emergency Department (HOSPITAL_COMMUNITY): Payer: BLUE CROSS/BLUE SHIELD

## 2016-03-05 LAB — URINALYSIS, ROUTINE W REFLEX MICROSCOPIC
Bilirubin Urine: NEGATIVE
GLUCOSE, UA: NEGATIVE mg/dL
HGB URINE DIPSTICK: NEGATIVE
KETONES UR: NEGATIVE mg/dL
LEUKOCYTES UA: NEGATIVE
Nitrite: NEGATIVE
PROTEIN: NEGATIVE mg/dL
Specific Gravity, Urine: 1.027 (ref 1.005–1.030)
pH: 5.5 (ref 5.0–8.0)

## 2016-03-05 LAB — CBC
HCT: 45 % (ref 39.0–52.0)
Hemoglobin: 16.5 g/dL (ref 13.0–17.0)
MCH: 33.1 pg (ref 26.0–34.0)
MCHC: 36.7 g/dL — AB (ref 30.0–36.0)
MCV: 90.2 fL (ref 78.0–100.0)
PLATELETS: UNDETERMINED 10*3/uL (ref 150–400)
RBC: 4.99 MIL/uL (ref 4.22–5.81)
RDW: 12.5 % (ref 11.5–15.5)
WBC: 10.2 10*3/uL (ref 4.0–10.5)

## 2016-03-05 MED ORDER — FENTANYL CITRATE (PF) 100 MCG/2ML IJ SOLN
50.0000 ug | Freq: Once | INTRAMUSCULAR | Status: AC
  Administered 2016-03-05: 50 ug via INTRAVENOUS
  Filled 2016-03-05: qty 2

## 2016-03-05 MED ORDER — ONDANSETRON HCL 4 MG/2ML IJ SOLN
4.0000 mg | Freq: Once | INTRAMUSCULAR | Status: AC
  Administered 2016-03-05: 4 mg via INTRAVENOUS
  Filled 2016-03-05: qty 2

## 2016-03-05 MED ORDER — SUCRALFATE 1 G PO TABS
1.0000 g | ORAL_TABLET | Freq: Once | ORAL | Status: AC
  Administered 2016-03-05: 1 g via ORAL
  Filled 2016-03-05: qty 1

## 2016-03-05 MED ORDER — FENTANYL CITRATE (PF) 100 MCG/2ML IJ SOLN
100.0000 ug | Freq: Once | INTRAMUSCULAR | Status: AC
  Administered 2016-03-05: 100 ug via INTRAVENOUS
  Filled 2016-03-05: qty 2

## 2016-03-05 MED ORDER — SUCRALFATE 1 GM/10ML PO SUSP: 1.0000 g | Freq: Three times a day (TID) | ORAL | 0 refills | Status: DC

## 2016-03-05 MED ORDER — SODIUM CHLORIDE 0.9 % IV SOLN
Freq: Once | INTRAVENOUS | Status: AC
  Administered 2016-03-05: 04:00:00 via INTRAVENOUS

## 2016-03-05 MED ORDER — SODIUM CHLORIDE 0.9 % IV BOLUS (SEPSIS)
1000.0000 mL | Freq: Once | INTRAVENOUS | Status: AC
  Administered 2016-03-05: 1000 mL via INTRAVENOUS

## 2016-03-05 NOTE — Discharge Instructions (Signed)
You have been given a prescription for Carafate that she continues for gastric discomfort.  You've also been given referral to a local gastroenterologist who can further investigate your monthly episodes of abdominal pain, nausea and vomiting

## 2016-03-05 NOTE — ED Provider Notes (Signed)
[facility]-EMERGENCY DEPT Provider Note   CSN: [id number] Arrival date & time: 03/04/16  2240  By signing my name below, I, Alyssa Grove, attest that this documentation has been prepared under the direction and in the presence of Earley Favor, NP. Electronically Signed: Alyssa Grove, ED Scribe. 03/05/16. 12:27 AM.  First MD Initiated Contact with Patient 03/05/16 0017    History   Chief Complaint Chief Complaint  Patient presents with  . Abdominal Pain   The history is provided by the patient. No language interpreter was used.    HPI Comments: Eddie Daniels is a 35 y.o. male withGERD who presents to the Emergency Department complaining of constant, generalized burning epigastric abdominal pain onset 5:30 PM. Pt reports associated nausea and vomiting. Pt states he vomited 10x. Pt states the pain did not come after eating a meal. Pt states this pain happens about once a month. He states it usually starts in the evening and last for 24 hours and then subsides. Pt had his gallbladder removed 1 year ago. Pt states this pain did not occur before having his gallbladder removed. Pt states he has been having normal bowel movements. Pt had an endoscopy that found nothing in 09/2015. Pt has not tried anything for relief of symptoms. Pt denies marijuana use or alcohol use. Pt denies fever.     Past Medical History:  Diagnosis Date  . GERD (gastroesophageal reflux disease)   . Hypertension    questionable.  said BP has been up when checked but has not seen PCP about it  . Snores     Patient Active Problem List   Diagnosis Date Noted  . Heartburn   . Diarrhea   . Reflux esophagitis   . Gastritis   . Noninfectious gastroenteritis, unspecified     Past Surgical History:  Procedure Laterality Date  . CHOLECYSTECTOMY    . COLONOSCOPY WITH PROPOFOL N/A 07/13/2015   Procedure: COLONOSCOPY WITH PROPOFOL;  Surgeon: Midge Minium, MD;  Location: [facility];  Service: Endoscopy;  Laterality: N/A;    . ESOPHAGOGASTRODUODENOSCOPY (EGD) WITH PROPOFOL N/A 07/13/2015   Procedure: ESOPHAGOGASTRODUODENOSCOPY (EGD) WITH PROPOFOL;  Surgeon: Midge Minium, MD;  Location: [facility];  Service: Endoscopy;  Laterality: N/A;  . INCISION AND DRAINAGE Right 01/25/2016   Procedure: INCISION AND DRAINAGE;  Surgeon: Betha Loa, MD;  Location: [facility];  Service: Orthopedics;  Laterality: Right;  right ring I&D and pinning/repair nail bed   . Left hand surgery     Broken at age 68       Home Medications    Prior to Admission medications   Medication Sig Start Date End Date Taking? Authorizing Provider  dicyclomine (BENTYL) 20 MG tablet Take 1 tablet (20 mg total) by mouth 2 (two) times daily. Patient not taking: Reported on 03/04/2016 07/05/15   Midge Minium, MD  HYDROcodone-acetaminophen [facility]) 5-325 MG tablet 1-2 tabs po q6 hours prn pain Patient not taking: Reported on 03/04/2016 01/25/16   Betha Loa, MD  HYDROcodone-acetaminophen (NORCO/VICODIN) 5-325 MG tablet 1-2 tabs po q6 hours prn pain 01/25/16   Historical Provider, MD  naproxen (NAPROSYN) 500 MG tablet Take 1 tablet (500 mg total) by mouth 2 (two) times daily. Patient not taking: Reported on 03/04/2016 08/29/15   Lutricia Feil, PA-C  ondansetron (ZOFRAN ODT) 4 MG disintegrating tablet Take 1 tablet (4 mg total) by mouth every 8 (eight) hours as needed for nausea or vomiting. Patient not taking: Reported on 07/05/2015 06/23/15  Alvira MondayErin Schlossman, MD  ranitidine (ZANTAC) 150 MG tablet Take 1 tablet (150 mg total) by mouth 2 (two) times daily. Patient not taking: Reported on 03/04/2016 07/05/15   Midge Miniumarren Wohl, MD  sucralfate (CARAFATE) 1 GM/10ML suspension Take 10 mLs (1 g total) by mouth 4 (four) times daily -  with meals and at bedtime. 03/05/16   Earley FavorGail Mirela Parsley, NP  sulfamethoxazole-trimethoprim (BACTRIM DS) 800-160 MG tablet Take 1 tablet by mouth 2 (two) times daily. Patient not taking: Reported on 03/04/2016 01/25/16   Betha LoaKevin Kuzma,  MD    Family History Family History  Problem Relation Age of Onset  . Diverticulitis Mother   . Stroke Father   . Heart disease Father     Social History Social History  Substance Use Topics  . Smoking status: Never Smoker  . Smokeless tobacco: Never Used  . Alcohol use No     Allergies   Proton pump inhibitors   Review of Systems Review of Systems  Constitutional: Negative for chills and fever.  Respiratory: Negative for cough and shortness of breath.   Cardiovascular: Negative for chest pain.  Gastrointestinal: Positive for abdominal pain, nausea and vomiting. Negative for abdominal distention, constipation and diarrhea.  Genitourinary: Negative for dysuria.  Neurological: Negative for headaches.  All other systems reviewed and are negative.    Physical Exam Updated Vital Signs BP 147/92 (BP Location: Left Arm)   Pulse 72   Temp 98.5 F (36.9 C) (Oral)   Resp 18   SpO2 100%   Physical Exam  Constitutional: He appears well-developed and well-nourished.  HENT:  Head: Normocephalic.  Eyes: Pupils are equal, round, and reactive to light.  Neck: Normal range of motion.  Cardiovascular: Normal rate and regular rhythm.   Pulmonary/Chest: Effort normal.  Abdominal: Soft. Bowel sounds are normal. He exhibits no distension. There is tenderness in the epigastric area. There is no tenderness at McBurney's point and negative Murphy's sign.    Nursing note and vitals reviewed.    ED Treatments / Results  DIAGNOSTIC STUDIES: Oxygen Saturation is 100% on RA, normal by my interpretation.    COORDINATION OF CARE: 12:24 AM Discussed treatment plan with pt at bedside which includes Iv hydration, pain control and antiemetic,  pt agreed to plan.  Labs (all labs ordered are listed, but only abnormal results are displayed) Labs Reviewed  COMPREHENSIVE METABOLIC PANEL - Abnormal; Notable for the following:       Result Value   CO2 18 (*)    Glucose, Bld 124 (*)     All other components within normal limits  CBC - Abnormal; Notable for the following:    MCHC 36.7 (*)    All other components within normal limits  LIPASE, BLOOD  URINALYSIS, ROUTINE W REFLEX MICROSCOPIC (NOT AT [facility])    EKG  EKG Interpretation None       Radiology [place] Abdomen Complete  Result Date: 03/05/2016 CLINICAL DATA:  Abdominal pain since yesterday. Intermittent pain for months. EXAM: ABDOMEN ULTRASOUND COMPLETE COMPARISON:  CT 06/23/2015 FINDINGS: Gallbladder: Surgically absent. Common bile duct: Diameter: 4 mm Liver: No focal lesion identified. Diffusely increased and heterogeneous in parenchymal echogenicity. Normal directional flow in the main portal vein. IVC: No abnormality visualized. Pancreas: Visualized portion unremarkable. Spleen: Size and appearance within normal limits. Right Kidney: Length: 12.0 cm. Echogenicity within normal limits. No mass or hydronephrosis visualized. Left Kidney: Length: 12.6 cm. Echogenicity within normal limits. No mass or hydronephrosis visualized. Abdominal aorta: No aneurysm visualized. Other findings: No  ascites. IMPRESSION: 1. Postcholecystectomy.  No biliary dilatation. 2. Mild hepatic steatosis. 3. Otherwise unremarkable abdominal ultrasound. Electronically Signed   By: Rubye Oaks M.D.   On: 03/05/2016 02:48    Procedures Procedures (including critical care time)  Medications Ordered in ED Medications  ondansetron (ZOFRAN) injection 4 mg (4 mg Intravenous Given 03/04/16 2356)  sodium chloride 0.9 % bolus 1,000 mL (0 mLs Intravenous Stopped 03/05/16 0220)  ondansetron (ZOFRAN) injection 4 mg (4 mg Intravenous Given 03/05/16 0044)  fentaNYL (SUBLIMAZE) injection 50 mcg (50 mcg Intravenous Given 03/05/16 0042)  fentaNYL (SUBLIMAZE) injection 100 mcg (100 mcg Intravenous Given 03/05/16 0339)  0.9 %  sodium chloride infusion ( Intravenous New Bag/Given 03/05/16 0343)  sucralfate (CARAFATE) tablet 1 g (1 g Oral Given 03/05/16 0550)     Initial  Impression / Assessment and Plan / ED Course  I have reviewed the triage vital signs and the nursing notes.  Pertinent labs & imaging results that were available during my care of the patient were reviewed by me and considered in my medical decision making (see chart for details).  Clinical Course       Final Clinical Impressions(s) / ED Diagnoses   Final diagnoses:  Epigastric pain  Nausea  Bilious vomiting with nausea    New Prescriptions New Prescriptions   SUCRALFATE (CARAFATE) 1 GM/10ML SUSPENSION    Take 10 mLs (1 g total) by mouth 4 (four) times daily -  with meals and at bedtime.   I personally performed the services described in this documentation, which was scribed in my presence. The recorded information has been reviewed and is accurate.    Earley Favor, NP 03/05/16 0045    Earley Favor, NP 03/05/16 1610    Dione Booze, MD 03/06/16 [phone]

## 2016-08-30 ENCOUNTER — Other Ambulatory Visit: Payer: Self-pay | Admitting: Gastroenterology

## 2016-09-06 ENCOUNTER — Telehealth: Payer: Self-pay | Admitting: Gastroenterology

## 2016-09-06 ENCOUNTER — Other Ambulatory Visit: Payer: Self-pay

## 2016-09-06 MED ORDER — DICYCLOMINE HCL 20 MG PO TABS: 20.0000 mg | ORAL_TABLET | Freq: Two times a day (BID) | ORAL | 3 refills | Status: DC

## 2016-09-06 MED ORDER — RANITIDINE HCL 150 MG PO TABS: 150.0000 mg | ORAL_TABLET | Freq: Two times a day (BID) | ORAL | 3 refills | Status: DC

## 2016-09-06 NOTE — Telephone Encounter (Signed)
Patient needs a refill on Bentyl and Zantac. They have a new pharmacy for you to use and add please [facility] [phone]

## 2016-09-06 NOTE — Telephone Encounter (Signed)
Rx's sent to pt' pharmacy per his request.

## 2017-02-17 ENCOUNTER — Other Ambulatory Visit: Payer: Self-pay | Admitting: Gastroenterology

## 2019-06-11 DIAGNOSIS — I10 Essential (primary) hypertension: Secondary | ICD-10-CM | POA: Diagnosis not present

## 2019-06-11 DIAGNOSIS — I160 Hypertensive urgency: Secondary | ICD-10-CM | POA: Diagnosis not present

## 2019-06-11 DIAGNOSIS — I629 Nontraumatic intracranial hemorrhage, unspecified: Secondary | ICD-10-CM | POA: Diagnosis not present

## 2019-06-11 DIAGNOSIS — R519 Headache, unspecified: Secondary | ICD-10-CM | POA: Diagnosis not present

## 2019-06-11 DIAGNOSIS — G44009 Cluster headache syndrome, unspecified, not intractable: Secondary | ICD-10-CM | POA: Diagnosis not present

## 2019-06-12 DIAGNOSIS — I160 Hypertensive urgency: Secondary | ICD-10-CM | POA: Diagnosis not present

## 2019-06-12 DIAGNOSIS — R519 Headache, unspecified: Secondary | ICD-10-CM | POA: Diagnosis not present

## 2019-06-14 ENCOUNTER — Ambulatory Visit (HOSPITAL_COMMUNITY)
Admission: EM | Admit: 2019-06-14 | Discharge: 2019-06-14 | Disposition: A | Discharge status: Home / Self Care | Payer: BC Managed Care – PPO | Source: Home / Self Care | Attending: Internal Medicine | Admitting: Internal Medicine

## 2019-06-14 ENCOUNTER — Emergency Department (HOSPITAL_COMMUNITY)
Admission: EM | Admit: 2019-06-14 | Discharge: 2019-06-14 | Disposition: A | Discharge status: Home / Self Care | Payer: BC Managed Care – PPO | Attending: Emergency Medicine | Admitting: Emergency Medicine

## 2019-06-14 ENCOUNTER — Encounter (HOSPITAL_COMMUNITY): Payer: Self-pay

## 2019-06-14 ENCOUNTER — Encounter (HOSPITAL_COMMUNITY): Payer: Self-pay | Admitting: Emergency Medicine

## 2019-06-14 ENCOUNTER — Other Ambulatory Visit: Payer: Self-pay

## 2019-06-14 DIAGNOSIS — Z5321 Procedure and treatment not carried out due to patient leaving prior to being seen by health care provider: Secondary | ICD-10-CM | POA: Diagnosis not present

## 2019-06-14 DIAGNOSIS — G4452 New daily persistent headache (NDPH): Secondary | ICD-10-CM

## 2019-06-14 DIAGNOSIS — R519 Headache, unspecified: Secondary | ICD-10-CM | POA: Diagnosis not present
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
## 2019-06-14 NOTE — ED Notes (Signed)
Vital sign were reported to provider N. Burky.
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
## 2019-06-14 NOTE — ED Triage Notes (Signed)
Pt complaint of ongoing headache since Wednesday; pt seen on Friday and prescribed blood pressure medication; no relief. Denies CP or SOB.

## 2019-06-19 DIAGNOSIS — Z6837 Body mass index (BMI) 37.0-37.9, adult: Secondary | ICD-10-CM | POA: Diagnosis not present

## 2019-06-19 DIAGNOSIS — E669 Obesity, unspecified: Secondary | ICD-10-CM | POA: Diagnosis not present

## 2019-06-19 DIAGNOSIS — R519 Headache, unspecified: Secondary | ICD-10-CM | POA: Diagnosis not present

## 2019-06-19 DIAGNOSIS — I10 Essential (primary) hypertension: Secondary | ICD-10-CM | POA: Diagnosis not present

## 2019-06-28 DIAGNOSIS — Z6837 Body mass index (BMI) 37.0-37.9, adult: Secondary | ICD-10-CM | POA: Diagnosis not present

## 2019-06-28 DIAGNOSIS — I10 Essential (primary) hypertension: Secondary | ICD-10-CM | POA: Diagnosis not present

## 2019-06-28 DIAGNOSIS — R519 Headache, unspecified: Secondary | ICD-10-CM | POA: Diagnosis not present

## 2019-06-28 DIAGNOSIS — E669 Obesity, unspecified: Secondary | ICD-10-CM | POA: Diagnosis not present

## 2019-06-29 ENCOUNTER — Encounter: Payer: Self-pay | Admitting: *Deleted

## 2019-06-30 ENCOUNTER — Encounter: Payer: Self-pay | Admitting: Neurology

## 2019-06-30 ENCOUNTER — Ambulatory Visit: Payer: BC Managed Care – PPO | Admitting: Neurology

## 2019-06-30 ENCOUNTER — Other Ambulatory Visit: Payer: Self-pay

## 2019-06-30 VITALS — BP 144/96 | HR 78 | Temp 98.1°F | Ht 75.0 in | Wt 303.0 lb

## 2019-06-30 DIAGNOSIS — E669 Obesity, unspecified: Secondary | ICD-10-CM

## 2019-06-30 DIAGNOSIS — R202 Paresthesia of skin: Secondary | ICD-10-CM

## 2019-06-30 DIAGNOSIS — Z9189 Other specified personal risk factors, not elsewhere classified: Secondary | ICD-10-CM

## 2019-06-30 DIAGNOSIS — G44011 Episodic cluster headache, intractable: Secondary | ICD-10-CM | POA: Diagnosis not present

## 2019-06-30 DIAGNOSIS — R0683 Snoring: Secondary | ICD-10-CM

## 2019-06-30 MED ORDER — SUMATRIPTAN SUCCINATE 50 MG PO TABS: 50.0000 mg | ORAL_TABLET | ORAL | 0 refills | Status: AC | PRN
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
## 2019-06-30 NOTE — Patient Instructions (Signed)
Your headache may be a function of your newly diagnosed high blood pressure.  You may be at risk for obstructive sleep apnea.  You may have cluster headaches.  These are all possibilities and you may have a combination of these issues.  Your neurological exam is normal thankfully.  Nevertheless, I will advise you to keep your cardiology appointment.  Please talk to your cardiologist about the possibility of adding verapamil which is a medication we use for cluster headache prevention.  If he is agreeable, this may also help your blood pressure management. For now, I recommend as needed use of Imitrex, 50 mg tab: take 1 pill early on when you suspect a headache attack come on. You may take another pill within 2 hours, no more than 2 pills in 24 hours. Most people who take triptans do not have any serious side-effects. However, they can cause drowsiness (remember to not drive or use heavy machinery when drowsy), nausea, dizziness, dry mouth. Less common side effects include strange sensations, such as tightness in your chest or throat, tingling, flushing, and feelings of heaviness or pressure in areas such as the face, limbs, and chest. These in the chest can mimic heart related pain (angina) and may cause alarm, but usually these sensations are not harmful or a sign of a heart attack. However, if you develop intense chest pain or sensations of discomfort, you should stop taking your medication and consult with me or your PCP or go to the nearest urgent care facility or ER or call 911.   May repeat in 2 hours if headache persists. No more than 2 pills/24 h, no more than 3 pills/week. I would like to proceed with a brain MRI with and without contrast.  I will also order a sleep study.  We will call you to schedule these tests and I plan to see you back afterwards. Please also schedule a formal eye examination with an ophthalmologist or optometrist in your area.

## 2019-07-06 ENCOUNTER — Encounter: Payer: Self-pay | Admitting: Cardiology

## 2019-07-06 ENCOUNTER — Other Ambulatory Visit: Payer: Self-pay

## 2019-07-06 ENCOUNTER — Ambulatory Visit: Payer: BC Managed Care – PPO | Admitting: Cardiology

## 2019-07-06 VITALS — BP 146/102 | HR 91 | Temp 92.0°F | Ht 75.0 in | Wt 312.0 lb

## 2019-07-06 DIAGNOSIS — Z7189 Other specified counseling: Secondary | ICD-10-CM

## 2019-07-06 DIAGNOSIS — I10 Essential (primary) hypertension: Secondary | ICD-10-CM

## 2019-07-06 DIAGNOSIS — Z8249 Family history of ischemic heart disease and other diseases of the circulatory system: Secondary | ICD-10-CM | POA: Diagnosis not present

## 2019-07-06 DIAGNOSIS — R519 Headache, unspecified: Secondary | ICD-10-CM | POA: Diagnosis not present

## 2019-07-06 DIAGNOSIS — Z9189 Other specified personal risk factors, not elsewhere classified: Secondary | ICD-10-CM

## 2019-07-06 MED ORDER — LISINOPRIL 20 MG PO TABS: 20.0000 mg | ORAL_TABLET | Freq: Every day | ORAL | 11 refills | Status: DC

## 2019-07-06 NOTE — Patient Instructions (Signed)
Medication Instructions:  Start: Lisinopril 10 mg daily  *If you need a refill on your cardiac medications before your next appointment, please call your pharmacy*  Lab Work: Your physician recommends that you return for lab work in 10-14 days (Fasting lipids, BMP)  If you have labs (blood work) drawn today and your tests are completely normal, you will receive your results only by: Marland Kitchen MyChart Message (if you have MyChart) OR . A paper copy in the mail If you have any lab test that is abnormal or we need to change your treatment, we will call you to review the results.  Testing/Procedures: None  Follow-Up: At [facility], you and your health needs are our priority.  As part of our continuing mission to provide you with exceptional heart care, we have created designated Provider Care Teams.  These Care Teams include your primary Cardiologist (physician) and Advanced Practice Providers (APPs -  Physician Assistants and Nurse Practitioners) who all work together to provide you with the care you need, when you need it.  Your next appointment:   6 week(s)  The format for your next appointment:   In Person  Provider:   Buford Dresser, MD
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
## 2019-07-07 ENCOUNTER — Telehealth: Payer: Self-pay | Admitting: Cardiology

## 2019-07-07 NOTE — Telephone Encounter (Signed)
Left message for patient to call and schedule 6 week follow up appointemnt with Dr. Harrell Gave

## 2019-07-26 ENCOUNTER — Ambulatory Visit (INDEPENDENT_AMBULATORY_CARE_PROVIDER_SITE_OTHER): Payer: BC Managed Care – PPO | Admitting: Neurology

## 2019-07-26 DIAGNOSIS — G44011 Episodic cluster headache, intractable: Secondary | ICD-10-CM

## 2019-07-26 DIAGNOSIS — Z9189 Other specified personal risk factors, not elsewhere classified: Secondary | ICD-10-CM

## 2019-07-26 DIAGNOSIS — E669 Obesity, unspecified: Secondary | ICD-10-CM

## 2019-07-26 DIAGNOSIS — G4733 Obstructive sleep apnea (adult) (pediatric): Secondary | ICD-10-CM

## 2019-07-26 DIAGNOSIS — R202 Paresthesia of skin: Secondary | ICD-10-CM

## 2019-07-26 DIAGNOSIS — R0683 Snoring: Secondary | ICD-10-CM

## 2019-07-28 NOTE — Procedures (Signed)
Patient Information     First Name: Eddie Last Name: Daniels ID: [id number]  Birth Date: 1981/01/28 Age: 38 Gender: Male  Referring Provider: Sarajane Jews, FNP BMI: 38.2 (W=304 lb, H=6' 3'')  Neck Circ.:  18 '' Epworth:  9/24   Sleep Study Information    Study Date: Jul 26, 2019 S/H/A Version: 001.001.001.001 / 4.1.1528 / 59  History:    38 year old man with a history of hypertension, reflux disease, and obesity, who reports recurrent headaches. He reports snoring. Summary & Diagnosis:     OSA Recommendations:     This home sleep test demonstrates severe obstructive sleep apnea with a total AHI of 36.4/hour and O2 nadir of 86%. Treatment with positive airway pressure (in the form of CPAP) is recommended. This will require a full night CPAP titration study for proper treatment settings, O2 monitoring and mask fitting. Based on the severity of the sleep disordered breathing an attended titration study is indicated. However, patient's insurance has denied an attended sleep study; therefore, the patient will be advised to proceed with an autoPAP titration/trial at home for now. Please note that untreated obstructive sleep apnea may carry additional perioperative morbidity. Patients with significant obstructive sleep apnea should receive perioperative PAP therapy and the surgeons and particularly the anesthesiologist should be informed of the diagnosis and the severity of the sleep disordered breathing. The patient should be cautioned not to drive, work at heights, or operate dangerous or heavy equipment when tired or sleepy. Review and reiteration of good sleep hygiene measures should be pursued with any patient. Other causes of the patient's symptoms, including circadian rhythm disturbances, an underlying mood disorder, medication effect and/or an underlying medical problem cannot be ruled out based on this test. Clinical correlation is recommended. The patient and his referring provider will be  notified of the test results. The patient will be seen in follow up in sleep clinic at [facility].  I certify that I have reviewed the raw data recording prior to the issuance of this report in accordance with the standards of the American Academy of Sleep Medicine (AASM).  Star Age, MD, PhD [facility]) Diplomat, ABPN (Neurology and Sleep)                 Sleep Summary  Oxygen Saturation Statistics   Start Study Time: End Study Time: Total Recording Time:         10:34:31 PM 6:21:14 AM   7 h, 46 min  Total Sleep Time Inconclusive REM Detection 6 h, 47 min    Mean: 95 Minimum: 86 Maximum: 100  Mean of Desaturations Nadirs (%):   93  Oxygen Desaturation. %:   4-9 10-20 >20 Total  Events Number Total   117  1 99.2 0.8  0 0.0  118 100.0  Oxygen Saturation: <90 <=88 <85 <80 <70  Duration (minutes): Sleep % 0.9 0.2  0.5 0.0  0.1 0.0 0.0 0.0 0.0 0.0     Respiratory Indices      Total Events REM NREM All Night  pRDI:  272  pAHI:  246 ODI:  118  pAHIc:  11  % CSR: 0.0 N/A N/A N/A N/A N/A N/A N/A N/A 40.2 36.4 17.5 1.6       Pulse Rate Statistics during Sleep (BPM)      Mean:  68 Minimum: 52  Maximum: 104    Indices are calculated using technically valid sleep time of  6 h, 45 min. pRDI/pAHI  are calculated using oxi desaturations ? 3%    Body Position Statistics  Position Supine Prone Right Left Non-Supine  Sleep (min) 244.5 39.0 28.0 95.5 162.5  Sleep % 60.1 9.6 6.9 23.5 39.9  pRDI 45.7 9.4 53.6 34.6 32.0  pAHI 43.0 7.9 36.5 30.8 26.4  ODI 20.2 1.6 21.5 15.7 13.4     Snoring Statistics Snoring Level (dB) >40 >50 >60 >70 >80 >Threshold (45)  Sleep (min) 205.8 27.3 3.7 0.0 0.0 81.4  Sleep % 50.6 6.7 0.9 0.0 0.0 20.0    Mean: 43 dB Sleep Stages Chart                                                       pAHI=36.4                                                                   Mild              Moderate                     Severe                                                 5              15                    30

## 2019-07-28 NOTE — Addendum Note (Signed)
Addended by: Star Age on: 07/28/2019 10:39 AM   Modules accepted: Orders

## 2019-07-28 NOTE — Progress Notes (Signed)
Patient referred by Sarajane Jews, NP, seen by me on 06/30/19 for new onset HAs, HST on 07/26/19.    Please call and notify the patient that the recent home sleep test showed obstructive sleep apnea in the severe range. While I recommend treatment for this in the form CPAP, his insurance will not approve a sleep study for this. They will likely only approve a trial of autoPAP, which means, that we don't have to bring him in for a sleep study with CPAP, but will let him try an autoPAP machine at home, through a DME company (of his choice, or as per insurance requirement). The DME representative will educate him on how to use the machine, how to put the mask on, etc. I have placed an order in the chart. Please send referral, talk to patient, send report to referring MD. We will need a FU in sleep clinic for 10 weeks post-PAP set up, please arrange that with me or one of our NPs.  Treatment of his OSA and his BP will likely aid in alleviating his HAs. Thanks,   Star Age, MD, PhD [facility])

## 2019-07-29 ENCOUNTER — Telehealth: Payer: Self-pay | Admitting: Neurology

## 2019-07-29 NOTE — Telephone Encounter (Signed)
Called patient to discuss sleep study results. No answer at this time. LVM for the patient to call back.  Advised on VM our office is closed on today and tomorrow and we will reopen on Monday.

## 2019-07-29 NOTE — Telephone Encounter (Signed)
-----   Message from Star Age, MD sent at 07/28/2019 10:39 AM EST ----- Patient referred by Sarajane Jews, NP, seen by me on 06/30/19 for new onset HAs, HST on 07/26/19.    Please call and notify the patient that the recent home sleep test showed obstructive sleep apnea in the severe range. While I recommend treatment for this in the form CPAP, his insurance will not approve a sleep study for this. They will likely only approve a trial of autoPAP, which means, that we don't have to bring him in for a sleep study with CPAP, but will let him try an autoPAP machine at home, through a DME company (of his choice, or as per insurance requirement). The DME representative will educate him on how to use the machine, how to put the mask on, etc. I have placed an order in the chart. Please send referral, talk to patient, send report to referring MD. We will need a FU in sleep clinic for 10 weeks post-PAP set up, please arrange that with me or one of our NPs.  Treatment of his OSA and his BP will likely aid in alleviating his HAs. Thanks,   Star Age, MD, PhD [facility])

## 2019-08-02 DIAGNOSIS — I10 Essential (primary) hypertension: Secondary | ICD-10-CM | POA: Diagnosis not present

## 2019-08-02 DIAGNOSIS — R519 Headache, unspecified: Secondary | ICD-10-CM | POA: Diagnosis not present

## 2019-08-03 ENCOUNTER — Encounter: Payer: Self-pay | Admitting: Cardiology

## 2019-08-03 DIAGNOSIS — Z8249 Family history of ischemic heart disease and other diseases of the circulatory system: Secondary | ICD-10-CM | POA: Insufficient documentation

## 2019-08-04 NOTE — Telephone Encounter (Signed)
I called pt. I advised pt that Dr. Frances Furbish reviewed their sleep study results and found that pt has severe sleep apnea. Dr. Frances Furbish recommends that pt starts auto CPAP. I reviewed PAP compliance expectations with the pt. Pt is agreeable to starting a CPAP. I advised pt that an order will be sent to a DME, [facility], and [facility] will call the pt within about one week after they file with the pt's insurance. [facility] will show the pt how to use the machine, fit for masks, and troubleshoot the CPAP if needed. A follow up appt was made for insurance purposes with Butch Penny, NP on March 22,2021 at 8:30 am. Pt verbalized understanding to arrive 15 minutes early and bring their CPAP. A letter with all of this information in it will be mailed to the pt as a reminder. I verified with the pt that the address we have on file is correct. Pt verbalized understanding of results. Pt had no questions at this time but was encouraged to call back if questions arise. I have sent the order to [facility] and have received confirmation that they have received the order.

## 2019-08-11 ENCOUNTER — Ambulatory Visit: Payer: BC Managed Care – PPO | Admitting: Cardiology

## 2019-09-30 DIAGNOSIS — G4733 Obstructive sleep apnea (adult) (pediatric): Secondary | ICD-10-CM | POA: Diagnosis not present

## 2019-10-18 ENCOUNTER — Ambulatory Visit: Payer: Self-pay | Admitting: Adult Health

## 2019-10-31 DIAGNOSIS — G4733 Obstructive sleep apnea (adult) (pediatric): Secondary | ICD-10-CM | POA: Diagnosis not present

## 2019-11-30 DIAGNOSIS — G4733 Obstructive sleep apnea (adult) (pediatric): Secondary | ICD-10-CM | POA: Diagnosis not present

## 2019-12-31 DIAGNOSIS — G4733 Obstructive sleep apnea (adult) (pediatric): Secondary | ICD-10-CM | POA: Diagnosis not present

## 2020-01-30 DIAGNOSIS — G4733 Obstructive sleep apnea (adult) (pediatric): Secondary | ICD-10-CM | POA: Diagnosis not present

## 2020-03-01 DIAGNOSIS — G4733 Obstructive sleep apnea (adult) (pediatric): Secondary | ICD-10-CM | POA: Diagnosis not present

## 2020-04-01 DIAGNOSIS — G4733 Obstructive sleep apnea (adult) (pediatric): Secondary | ICD-10-CM | POA: Diagnosis not present

## 2020-05-01 DIAGNOSIS — G4733 Obstructive sleep apnea (adult) (pediatric): Secondary | ICD-10-CM | POA: Diagnosis not present

## 2020-06-01 DIAGNOSIS — G4733 Obstructive sleep apnea (adult) (pediatric): Secondary | ICD-10-CM | POA: Diagnosis not present

## 2021-04-03 ENCOUNTER — Emergency Department
Admission: EM | Admit: 2021-04-03 | Discharge: 2021-04-03 | Disposition: A | Discharge status: Home / Self Care | Payer: BC Managed Care – PPO | Attending: Emergency Medicine | Admitting: Emergency Medicine

## 2021-04-03 ENCOUNTER — Emergency Department: Payer: BC Managed Care – PPO

## 2021-04-03 DIAGNOSIS — Z79899 Other long term (current) drug therapy: Secondary | ICD-10-CM | POA: Diagnosis not present

## 2021-04-03 DIAGNOSIS — M5412 Radiculopathy, cervical region: Secondary | ICD-10-CM | POA: Insufficient documentation

## 2021-04-03 DIAGNOSIS — I10 Essential (primary) hypertension: Secondary | ICD-10-CM | POA: Diagnosis not present

## 2021-04-03 DIAGNOSIS — M542 Cervicalgia: Secondary | ICD-10-CM | POA: Diagnosis not present

## 2021-04-03 LAB — BASIC METABOLIC PANEL
Anion gap: 6 (ref 5–15)
BUN: 19 mg/dL (ref 6–20)
CO2: 27 mmol/L (ref 22–32)
Calcium: 9.3 mg/dL (ref 8.9–10.3)
Chloride: 102 mmol/L (ref 98–111)
Creatinine, Ser: 1.02 mg/dL (ref 0.61–1.24)
GFR, Estimated: >60 mL/min (ref >60)
Glucose, Bld: 92 mg/dL (ref 70–99)
Potassium: 4.4 mmol/L (ref 3.5–5.1)
Sodium: 135 mmol/L (ref 135–145)

## 2021-04-03 LAB — CBC
HCT: 43.9 % (ref 39.0–52.0)
Hemoglobin: 16.4 g/dL (ref 13.0–17.0)
MCH: 33.3 pg (ref 26.0–34.0)
MCHC: 37.4 g/dL — ABNORMAL HIGH (ref 30.0–36.0)
MCV: 89 fL (ref 80.0–100.0)
Platelets: 231 10*3/uL (ref 150–400)
RBC: 4.93 MIL/uL (ref 4.22–5.81)
RDW: 11.9 % (ref 11.5–15.5)
WBC: 6.7 10*3/uL (ref 4.0–10.5)
nRBC: 0 % (ref 0.0–0.2)

## 2021-04-03 LAB — TROPONIN I (HIGH SENSITIVITY): Troponin I (High Sensitivity): 3 ng/L (ref <18)

## 2021-04-03 MED ORDER — KETOROLAC TROMETHAMINE 30 MG/ML IJ SOLN
30.0000 mg | Freq: Once | INTRAMUSCULAR | Status: AC
  Administered 2021-04-03: 30 mg via INTRAMUSCULAR
  Filled 2021-04-03: qty 1

## 2021-04-03 MED ORDER — METHOCARBAMOL 500 MG PO TABS: 500.0000 mg | ORAL_TABLET | Freq: Three times a day (TID) | ORAL | 0 refills | Status: AC | PRN

## 2021-04-03 MED ORDER — PREDNISONE 10 MG (21) PO TBPK: ORAL_TABLET | ORAL | 0 refills | Status: AC

## 2021-04-03 MED ORDER — ORPHENADRINE CITRATE 30 MG/ML IJ SOLN
60.0000 mg | Freq: Two times a day (BID) | INTRAMUSCULAR | Status: DC
  Administered 2021-04-03: 60 mg via INTRAMUSCULAR
  Filled 2021-04-03: qty 2

## 2021-04-03 NOTE — ED Triage Notes (Signed)
Pt sent to ED from [facility] clinic due to neck pain and radiation numbness to L arm. Pt denies injury or trauma to this area. Pt is A&Ox4.

## 2021-04-03 NOTE — Discharge Instructions (Addendum)
Take tapered steroid as directed.  You can take Robaxin for muscle spasms.

## 2021-04-03 NOTE — ED Provider Notes (Signed)
[facility]-EMERGENCY DEPARTMENT  ____________________________________________  Time seen: Approximately 3:42 PM  I have reviewed the triage vital signs and the nursing notes.   HISTORY  Chief Complaint Neck Pain   Historian Patient     HPI Eddie Daniels is a 40 y.o. male presents to the emergency department with neck pain with numbness and tingling that radiates into the left upper extremity into the hand.  Patient denies localization of numbness and tingling in the fingertips.  He denies heavy lifting or recent falls or mechanisms of trauma.  He has been afebrile.  Denies sleeping in an atypical position.  Denies chest pain, chest tightness or abdominal pain.  No similar symptoms in the past.  He states that he has had symptoms for the past 2 to 3 days.   Past Medical History:  Diagnosis Date   GERD (gastroesophageal reflux disease)    Hypertension    questionable.  said BP has been up when checked but has not seen PCP about it   Snores      Immunizations up to date:  Yes.     Past Medical History:  Diagnosis Date   GERD (gastroesophageal reflux disease)    Hypertension    questionable.  said BP has been up when checked but has not seen PCP about it   Snores     Patient Active Problem List   Diagnosis Date Noted   Family history of hypertension 08/03/2019   Heartburn    Diarrhea    Reflux esophagitis    Gastritis    Noninfectious gastroenteritis, unspecified     Past Surgical History:  Procedure Laterality Date   CHOLECYSTECTOMY     COLONOSCOPY WITH PROPOFOL N/A 07/13/2015   Procedure: COLONOSCOPY WITH PROPOFOL;  Surgeon: Midge Minium, MD;  Location: [facility];  Service: Endoscopy;  Laterality: N/A;   ESOPHAGOGASTRODUODENOSCOPY (EGD) WITH PROPOFOL N/A 07/13/2015   Procedure: ESOPHAGOGASTRODUODENOSCOPY (EGD) WITH PROPOFOL;  Surgeon: Midge Minium, MD;  Location: [facility];  Service: Endoscopy;  Laterality: N/A;   INCISION AND DRAINAGE Right  01/25/2016   Procedure: INCISION AND DRAINAGE;  Surgeon: Betha Loa, MD;  Location: [facility];  Service: Orthopedics;  Laterality: Right;  right ring I&D and pinning/repair nail bed    Left hand surgery     Broken at age 49    Prior to Admission medications   Medication Sig Start Date End Date Taking? Authorizing Provider  methocarbamol (ROBAXIN) 500 MG tablet Take 1 tablet (500 mg total) by mouth every 8 (eight) hours as needed for up to 5 days. 04/03/21 04/08/21 Yes Pia Mau M, PA-C  predniSONE (STERAPRED UNI-PAK 21 TAB) 10 MG (21) TBPK tablet 6,6,5,5,4,4,3,3,2,2,1,1 04/03/21  Yes Pia Mau M, PA-C  amLODipine (NORVASC) 10 MG tablet Take 10 mg by mouth daily. 06/28/19   [provider]  aspirin-acetaminophen-caffeine (EXCEDRIN MIGRAINE) [phone] MG tablet Take 2 tablets by mouth every 6 (six) hours as needed for headache.    [provider]  dicyclomine (BENTYL) 20 MG tablet TAKE 1 TABLET BY MOUTH 2 (TWO) TIMES DAILY. 02/18/17   Midge Minium, MD  hydrochlorothiazide (HYDRODIURIL) 12.5 MG tablet Take by mouth. 06/11/19 06/10/20  [provider]  lisinopril (ZESTRIL) 20 MG tablet Take 1 tablet (20 mg total) by mouth daily. 07/06/19 10/04/19  Jodelle Red, MD  ranitidine (ZANTAC) 150 MG tablet TAKE 1 TABLET BY MOUTH 2 (TWO) TIMES DAILY. 02/18/17   Midge Minium, MD  SUMAtriptan (IMITREX) 50 MG tablet Take 1 tablet (50  mg total) by mouth every 2 (two) hours as needed for migraine. May repeat in 2 hours if headache persists or recurs. Not more than 3 pills a week, no more than 2 pills in 24 hours 06/30/19   Huston Foley, MD    Allergies Proton pump inhibitors  Family History  Problem Relation Age of Onset   Diverticulitis Mother    Stroke Father    Heart disease Father    Hypertension Sister    Hypertension Sister     Social History Social History   Tobacco Use   Smoking status: Never   Smokeless tobacco: Never  Substance Use  Topics   Alcohol use: Yes    Alcohol/week: 0.0 standard drinks    Comment: social   Drug use: No     Review of Systems  Constitutional: No fever/chills Eyes:  No discharge ENT: No upper respiratory complaints. Respiratory: no cough. No SOB/ use of accessory muscles to breath Gastrointestinal:   No nausea, no vomiting.  No diarrhea.  No constipation. Musculoskeletal: Patient has neck pain.  Skin: Negative for rash, abrasions, lacerations, ecchymosis.    ____________________________________________   PHYSICAL EXAM:  VITAL SIGNS: ED Triage Vitals  Enc Vitals Group     BP 04/03/21 1418 (!) 158/113     Pulse Rate 04/03/21 1418 61     Resp 04/03/21 1418 19     Temp 04/03/21 1418 98.8 F (37.1 C)     Temp Source 04/03/21 1418 Oral     SpO2 04/03/21 1418 99 %     Weight --      Height --      Head Circumference --      Peak Flow --      Pain Score 04/03/21 1419 8     Pain Loc --      Pain Edu? --      Excl. in GC? --      Constitutional: Alert and oriented. Well appearing and in no acute distress. Eyes: Conjunctivae are normal. PERRL. EOMI. Head: Atraumatic. ENT:      Nose: No congestion/rhinnorhea.      Mouth/Throat: Mucous membranes are moist.  Neck: No stridor.  Patient can exhibit full range of motion at the neck but winces as if he is in pain. Cardiovascular: Normal rate, regular rhythm. Normal S1 and S2.  Good peripheral circulation. Respiratory: Normal respiratory effort without tachypnea or retractions. Lungs CTAB. Good air entry to the bases with no decreased or absent breath sounds Gastrointestinal: Bowel sounds x 4 quadrants. Soft and nontender to palpation. No guarding or rigidity. No distention. Musculoskeletal: Patient has symmetric strength in the upper extremities.  Full range of motion to all extremities. No obvious deformities noted Neurologic:  Normal for age. No gross focal neurologic deficits are appreciated.  Skin:  Skin is warm, dry and intact.  No rash noted. Psychiatric: Mood and affect are normal for age. Speech and behavior are normal.   ____________________________________________   LABS (all labs ordered are listed, but only abnormal results are displayed)  Labs Reviewed  CBC - Abnormal; Notable for the following components:      Result Value   MCHC 37.4 (*)    All other components within normal limits  BASIC METABOLIC PANEL  TROPONIN I (HIGH SENSITIVITY)  TROPONIN I (HIGH SENSITIVITY)   ____________________________________________  EKG   ____________________________________________  RADIOLOGY Geraldo Pitter, personally viewed and evaluated these images (plain radiographs) as part of my medical decision making, as well as  reviewing the written report by the radiologist.    CT Cervical Spine Wo Contrast  Result Date: 04/03/2021 CLINICAL DATA:  Neck pain, left radicular symptoms EXAM: CT CERVICAL SPINE WITHOUT CONTRAST TECHNIQUE: Multidetector CT imaging of the cervical spine was performed without intravenous contrast. Multiplanar CT image reconstructions were also generated. COMPARISON:  None. FINDINGS: Alignment: Normal. Skull base and vertebrae: No acute fracture. No primary bone lesion or focal pathologic process. Soft tissues and spinal canal: No prevertebral fluid or swelling. No visible canal hematoma. Disc levels: Leftward endplate bony hypertrophy posteriorly at C5-6 with some degree of left neural foraminal narrowing. Difficult to exclude underlying disc disease by noncontrast CT. Remainder of the neural foramina appear widely patent. Upper chest: Negative. Other: None. IMPRESSION: No acute osseous finding, fracture or malalignment by CT. C5-6 degenerative changes as above. Electronically Signed   By: Judie Petit.  Shick M.D.   On: 04/03/2021 16:20    ____________________________________________    PROCEDURES  Procedure(s) performed:     Procedures     Medications  orphenadrine (NORFLEX) injection 60 mg  (60 mg Intramuscular Given 04/03/21 1553)  ketorolac (TORADOL) 30 MG/ML injection 30 mg (30 mg Intramuscular Given 04/03/21 1553)     ____________________________________________   INITIAL IMPRESSION / ASSESSMENT AND PLAN / ED COURSE  Pertinent labs & imaging results that were available during my care of the patient were reviewed by me and considered in my medical decision making (see chart for details).      Assessment and Plan:  Cervical radiculopathy 39 year old male presents to the emergency department with neck pain that radiates into the left hand.  Patient was hypertensive at triage but vital signs were otherwise reassuring.  EKG indicated normal sinus rhythm without ST segment elevation or other apparent arrhythmia.  Basic labs were obtained from [facility] clinic which were reassuring.  Troponin was within reference range.  CT of the cervical spine showed degenerative changes at C5-C6 but no other acute abnormality.  Patient was started on tapered prednisone and discharged with Robaxin.  He was given Toradol and Norflex in the emergency department.  Return precautions were given to return with new or worsening symptoms.  All patient questions were answered.    ____________________________________________  FINAL CLINICAL IMPRESSION(S) / ED DIAGNOSES  Final diagnoses:  Cervical radiculopathy      NEW MEDICATIONS STARTED DURING THIS VISIT:  ED Discharge Orders          Ordered    predniSONE (STERAPRED UNI-PAK 21 TAB) 10 MG (21) TBPK tablet        04/03/21 1640    methocarbamol (ROBAXIN) 500 MG tablet  Every 8 hours PRN        04/03/21 1640                This chart was dictated using voice recognition software/Dragon. Despite best efforts to proofread, errors can occur which can change the meaning. Any change was purely unintentional.     Orvil Feil, PA-C 04/03/21 1649    Sharyn Creamer, MD 04/04/21 [id number]

## 2021-04-11 ENCOUNTER — Telehealth: Payer: BC Managed Care – PPO | Admitting: Nurse Practitioner

## 2021-04-11 DIAGNOSIS — R112 Nausea with vomiting, unspecified: Secondary | ICD-10-CM | POA: Diagnosis not present

## 2021-04-11 MED ORDER — ONDANSETRON HCL 4 MG PO TABS: 4.0000 mg | ORAL_TABLET | Freq: Three times a day (TID) | ORAL | 0 refills | Status: AC | PRN

## 2021-04-11 NOTE — Progress Notes (Signed)

## 2021-10-23 DIAGNOSIS — M9903 Segmental and somatic dysfunction of lumbar region: Secondary | ICD-10-CM | POA: Diagnosis not present

## 2021-10-23 DIAGNOSIS — M5413 Radiculopathy, cervicothoracic region: Secondary | ICD-10-CM | POA: Diagnosis not present

## 2021-10-23 DIAGNOSIS — M530 Cervicocranial syndrome: Secondary | ICD-10-CM | POA: Diagnosis not present

## 2021-10-23 DIAGNOSIS — M9901 Segmental and somatic dysfunction of cervical region: Secondary | ICD-10-CM | POA: Diagnosis not present

## 2021-10-25 DIAGNOSIS — M530 Cervicocranial syndrome: Secondary | ICD-10-CM | POA: Diagnosis not present

## 2021-10-25 DIAGNOSIS — M5413 Radiculopathy, cervicothoracic region: Secondary | ICD-10-CM | POA: Diagnosis not present

## 2021-10-25 DIAGNOSIS — M9901 Segmental and somatic dysfunction of cervical region: Secondary | ICD-10-CM | POA: Diagnosis not present

## 2021-10-25 DIAGNOSIS — M9903 Segmental and somatic dysfunction of lumbar region: Secondary | ICD-10-CM | POA: Diagnosis not present

## 2021-10-29 DIAGNOSIS — M9901 Segmental and somatic dysfunction of cervical region: Secondary | ICD-10-CM | POA: Diagnosis not present

## 2021-10-29 DIAGNOSIS — M530 Cervicocranial syndrome: Secondary | ICD-10-CM | POA: Diagnosis not present

## 2021-10-29 DIAGNOSIS — M9903 Segmental and somatic dysfunction of lumbar region: Secondary | ICD-10-CM | POA: Diagnosis not present

## 2021-10-29 DIAGNOSIS — M5413 Radiculopathy, cervicothoracic region: Secondary | ICD-10-CM | POA: Diagnosis not present

## 2021-11-06 DIAGNOSIS — M5413 Radiculopathy, cervicothoracic region: Secondary | ICD-10-CM | POA: Diagnosis not present

## 2021-11-06 DIAGNOSIS — M9901 Segmental and somatic dysfunction of cervical region: Secondary | ICD-10-CM | POA: Diagnosis not present

## 2021-11-06 DIAGNOSIS — M530 Cervicocranial syndrome: Secondary | ICD-10-CM | POA: Diagnosis not present

## 2021-11-06 DIAGNOSIS — M9903 Segmental and somatic dysfunction of lumbar region: Secondary | ICD-10-CM | POA: Diagnosis not present

## 2021-11-08 DIAGNOSIS — M9901 Segmental and somatic dysfunction of cervical region: Secondary | ICD-10-CM | POA: Diagnosis not present

## 2021-11-08 DIAGNOSIS — M5413 Radiculopathy, cervicothoracic region: Secondary | ICD-10-CM | POA: Diagnosis not present

## 2021-11-08 DIAGNOSIS — M9903 Segmental and somatic dysfunction of lumbar region: Secondary | ICD-10-CM | POA: Diagnosis not present

## 2021-11-08 DIAGNOSIS — M530 Cervicocranial syndrome: Secondary | ICD-10-CM | POA: Diagnosis not present

## 2021-11-13 DIAGNOSIS — M9901 Segmental and somatic dysfunction of cervical region: Secondary | ICD-10-CM | POA: Diagnosis not present

## 2021-11-13 DIAGNOSIS — M9903 Segmental and somatic dysfunction of lumbar region: Secondary | ICD-10-CM | POA: Diagnosis not present

## 2021-11-13 DIAGNOSIS — M5413 Radiculopathy, cervicothoracic region: Secondary | ICD-10-CM | POA: Diagnosis not present

## 2021-11-13 DIAGNOSIS — M530 Cervicocranial syndrome: Secondary | ICD-10-CM | POA: Diagnosis not present

## 2022-04-17 IMAGING — CT CT CERVICAL SPINE W/O CM
3 of 4 series · 13 of 33 positions shown, 16 images · non-contrast
Comparison: None.

CLINICAL DATA: Neck pain, left radicular symptoms

EXAM:
CT CERVICAL SPINE WITHOUT CONTRAST
TECHNIQUE: Multidetector CT imaging of the cervical spine was performed without
intravenous contrast. Multiplanar CT image reconstructions were also
generated.

[Series 4: sagittal bone · sagittal · 0.32mm/px · 5 of 68 slices shown, 6 images]
[im 23/68  bone]
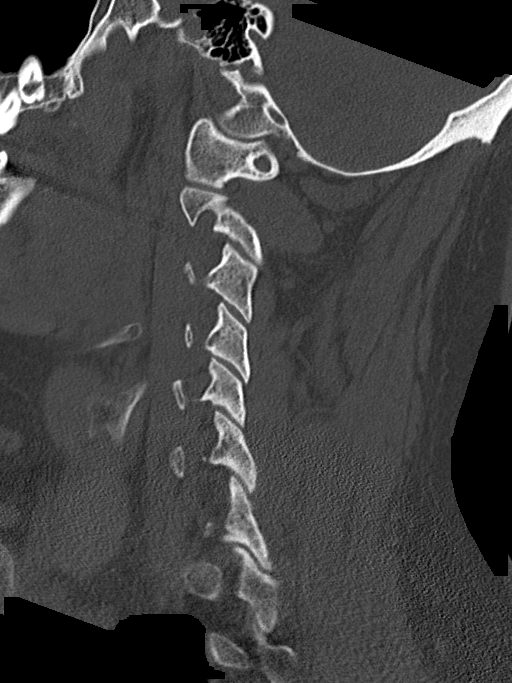
[im 28/68  bone]
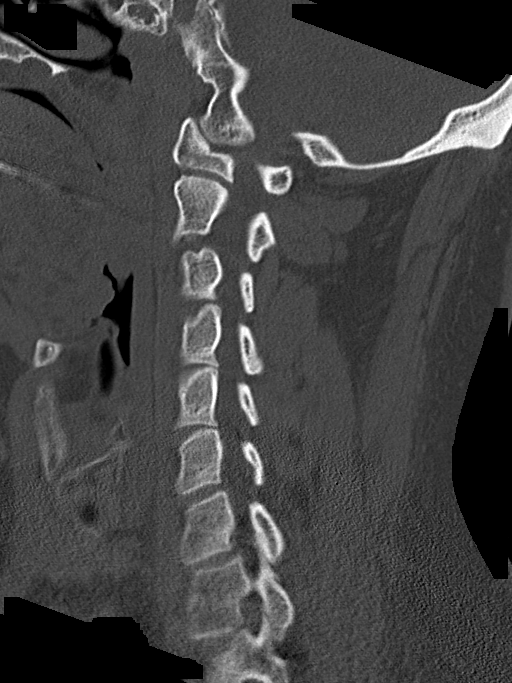
[im 34/68  soft-tissue]
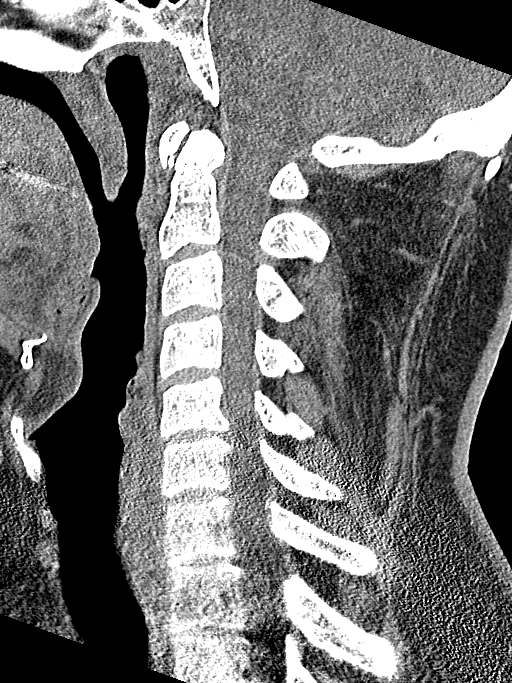
[im 34/68  bone]
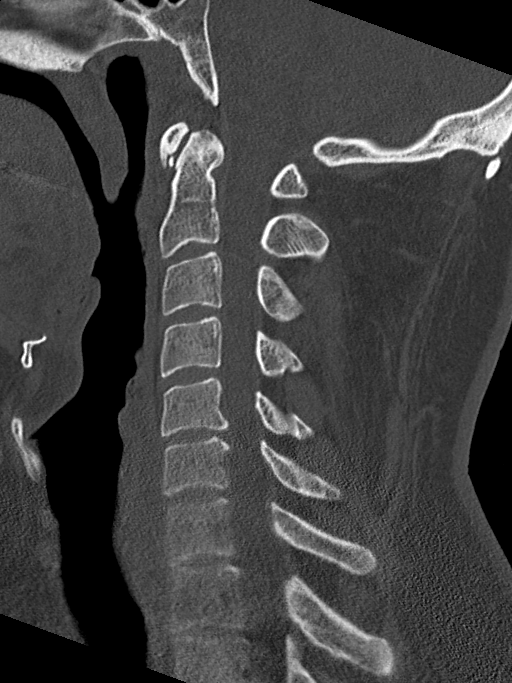
[im 40/68  bone]
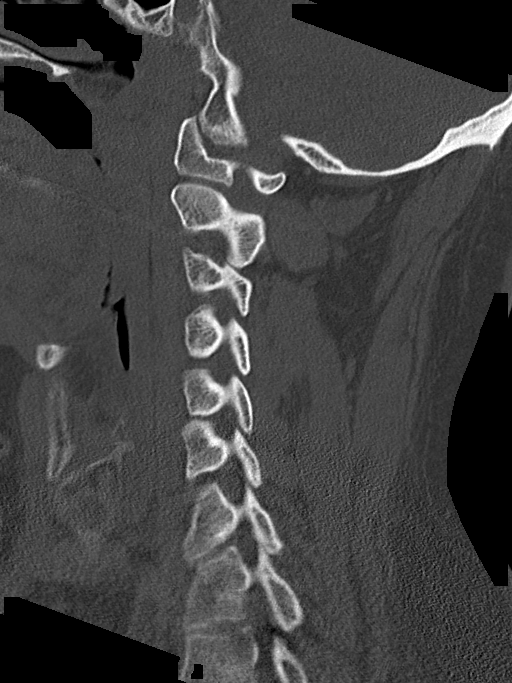
[im 45/68  bone]
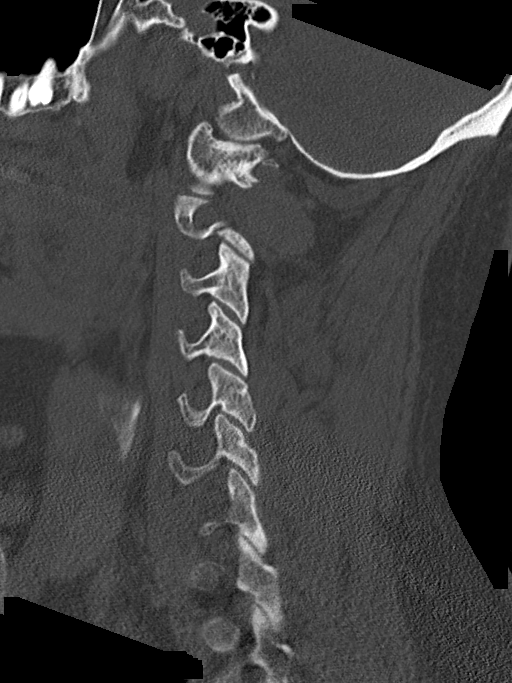

[Series 5: coronal bone · coronal · 0.32mm/px · 3 of 62 slices shown]
[im 13/62  bone]
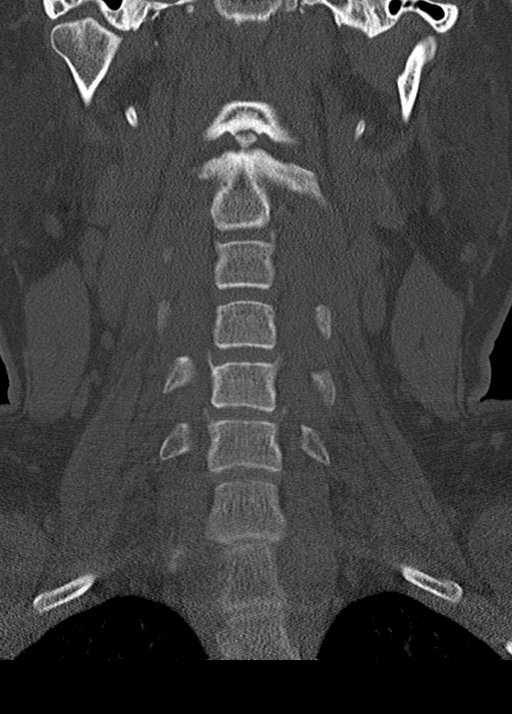
[im 25/62  bone]
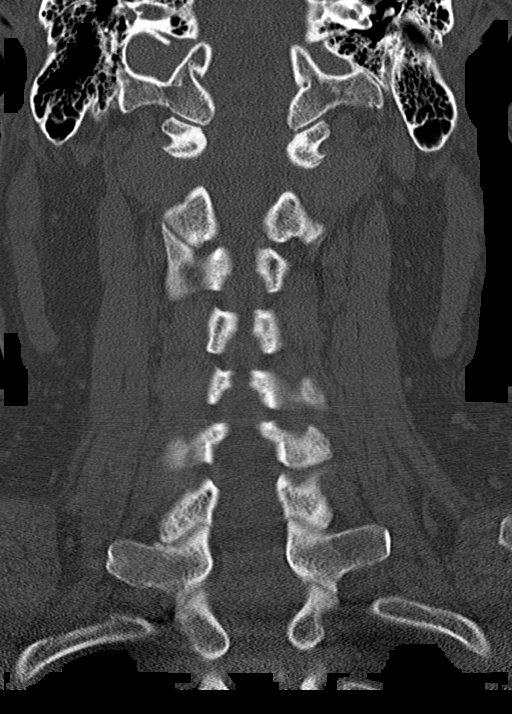
[im 37/62  bone]
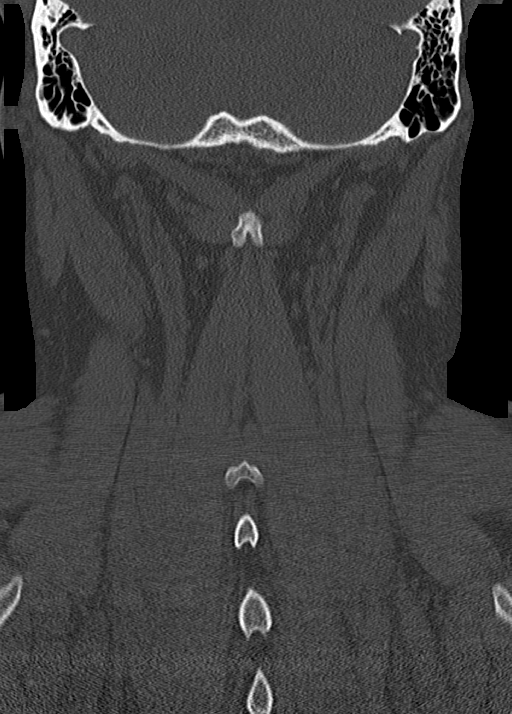

[Series 6: orthogonal bone · axial · 0.25mm/px · z∈[+1262,+1398]mm · 5 of 109 slices shown, 7 images]
[im 19/109  soft-tissue]
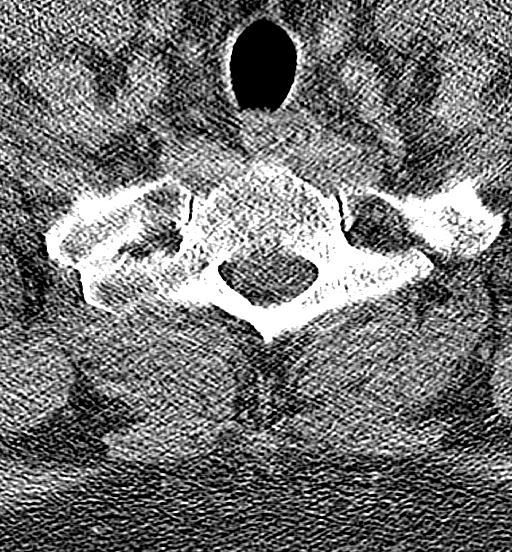
[im 19/109  bone]
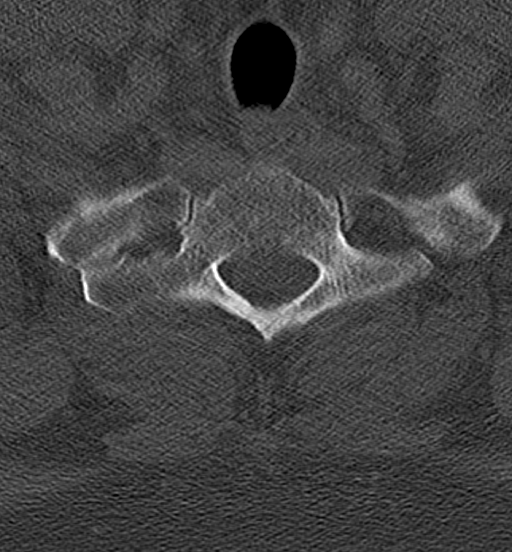
[im 37/109  bone]
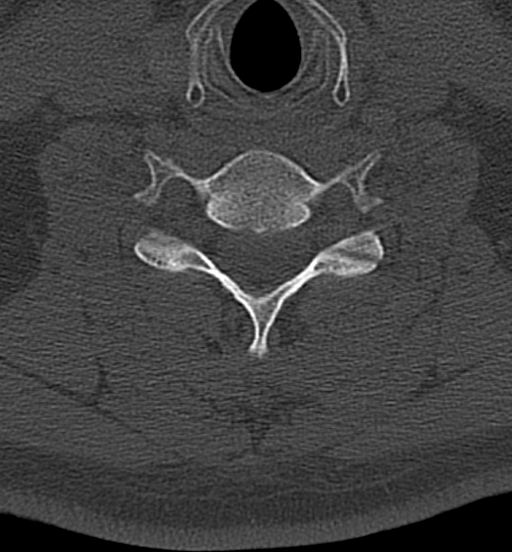
[im 55/109  bone]
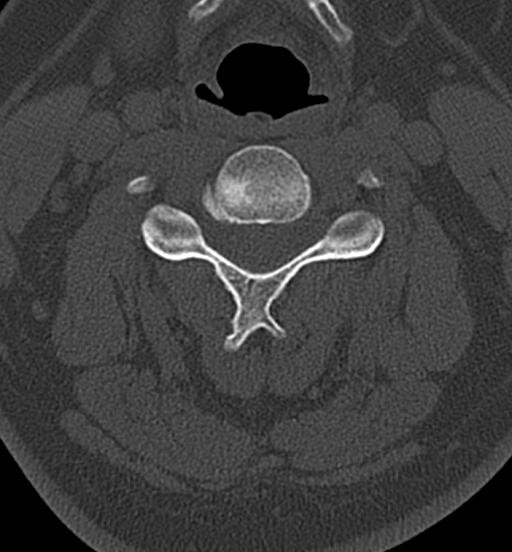
[im 73/109  bone]
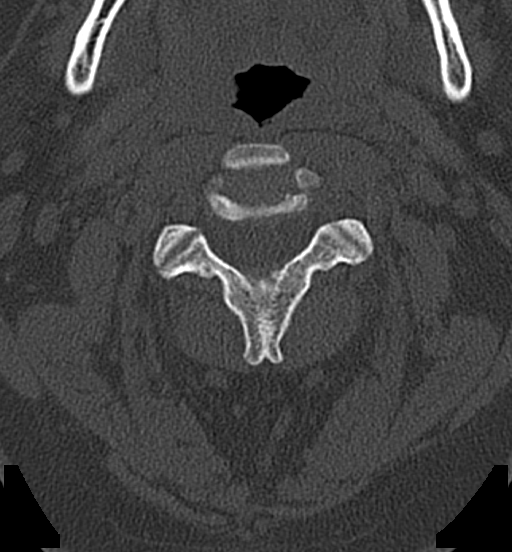
[im 91/109  soft-tissue]
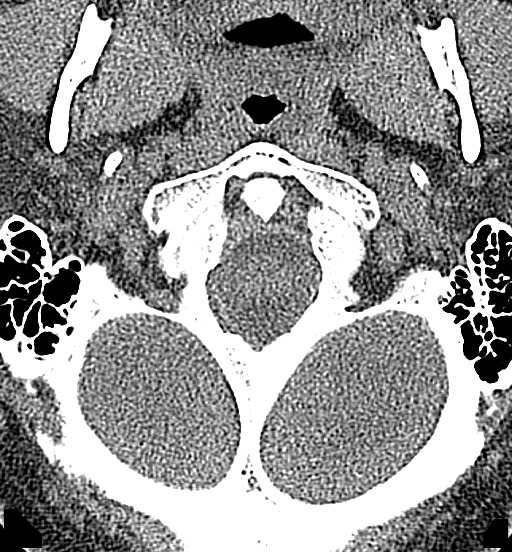
[im 91/109  bone]
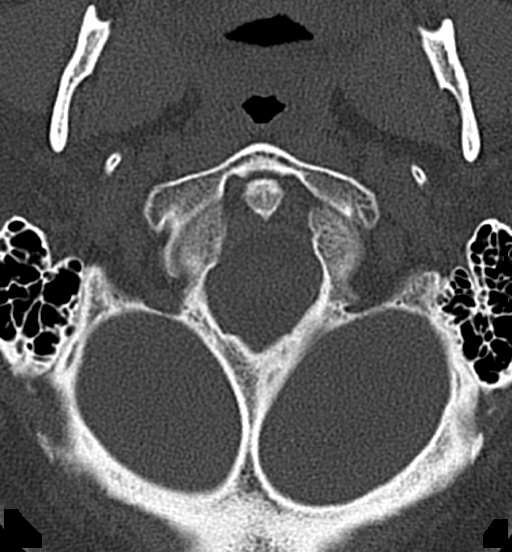

[13 of 33 positions shown; findings below may reference images not displayed]

FINDINGS: Alignment: Normal.

Skull base and vertebrae: No acute fracture. No primary bone lesion
or focal pathologic process.

Soft tissues and spinal canal: No prevertebral fluid or swelling. No
visible canal hematoma.

Disc levels: Leftward endplate bony hypertrophy posteriorly at C5-6
with some degree of left neural foraminal narrowing. Difficult to
exclude underlying disc disease by noncontrast CT. Remainder of the
neural foramina appear widely patent.

Upper chest: Negative.

Other: None.
IMPRESSION: No acute osseous finding, fracture or malalignment by CT.

C5-6 degenerative changes as above.

## 2023-06-13 ENCOUNTER — Encounter: Payer: Self-pay | Admitting: Emergency Medicine

## 2023-06-13 ENCOUNTER — Ambulatory Visit
Admission: EM | Admit: 2023-06-13 | Discharge: 2023-06-13 | Disposition: A | Discharge status: Home / Self Care | Payer: BC Managed Care – PPO | Attending: Emergency Medicine | Admitting: Emergency Medicine

## 2023-06-13 DIAGNOSIS — J020 Streptococcal pharyngitis: Secondary | ICD-10-CM | POA: Diagnosis not present

## 2023-06-13 DIAGNOSIS — R21 Rash and other nonspecific skin eruption: Secondary | ICD-10-CM | POA: Insufficient documentation

## 2023-06-13 LAB — GROUP A STREP BY PCR: Group A Strep by PCR: DETECTED — AB

## 2023-06-13 MED ORDER — AMOXICILLIN-POT CLAVULANATE 875-125 MG PO TABS: 1.0000 | ORAL_TABLET | Freq: Two times a day (BID) | ORAL | 0 refills | Status: AC

## 2023-06-13 NOTE — ED Provider Notes (Signed)
[facility]    CSN: [id number] Arrival date & time: 06/13/23  1128      History   Chief Complaint Chief Complaint  Patient presents with   Rash   Headache    HPI Eddie Daniels is a 42 y.o. male.   HPI  42 year old male with a past medical history significant for GERD, hypertension, and snoring presents for evaluation of skin rash that began 2 days ago.  He has a rash on his nose, in his nose, across his forehead, on both cheeks, both earlobes, palms, fingertips, and lesions on his toes of his left foot.  He describes the rash as being burning and itchy.  He does have an associated sore throat, headache, and a fever with a Tmax of 101.  That was early this morning.  He does have small children at home that are both school-age and in preschool but none of them have symptoms.  Patient is in the exam room with his wife.  He denies recent travel.  Past Medical History:  Diagnosis Date   GERD (gastroesophageal reflux disease)    Hypertension    questionable.  said BP has been up when checked but has not seen PCP about it   Snores     Patient Active Problem List   Diagnosis Date Noted   Family history of hypertension 08/03/2019   Heartburn    Diarrhea    Reflux esophagitis    Gastritis    Noninfectious gastroenteritis     Past Surgical History:  Procedure Laterality Date   CHOLECYSTECTOMY     COLONOSCOPY WITH PROPOFOL N/A 07/13/2015   Procedure: COLONOSCOPY WITH PROPOFOL;  Surgeon: Midge Minium, MD;  Location: [facility];  Service: Endoscopy;  Laterality: N/A;   ESOPHAGOGASTRODUODENOSCOPY (EGD) WITH PROPOFOL N/A 07/13/2015   Procedure: ESOPHAGOGASTRODUODENOSCOPY (EGD) WITH PROPOFOL;  Surgeon: Midge Minium, MD;  Location: [facility];  Service: Endoscopy;  Laterality: N/A;   INCISION AND DRAINAGE Right 01/25/2016   Procedure: INCISION AND DRAINAGE;  Surgeon: Betha Loa, MD;  Location: [facility];  Service: Orthopedics;  Laterality:  Right;  right ring I&D and pinning/repair nail bed    Left hand surgery     Broken at age 69       Home Medications    Prior to Admission medications   Medication Sig Start Date End Date Taking? Authorizing Provider  amoxicillin-clavulanate (AUGMENTIN) 875-125 MG tablet Take 1 tablet by mouth every 12 (twelve) hours for 10 days. 06/13/23 06/23/23 Yes Becky Augusta, NP  amLODipine (NORVASC) 10 MG tablet Take 10 mg by mouth daily. 06/28/19   [provider]  aspirin-acetaminophen-caffeine (EXCEDRIN MIGRAINE) [phone] MG tablet Take 2 tablets by mouth every 6 (six) hours as needed for headache.    [provider]  dicyclomine (BENTYL) 20 MG tablet TAKE 1 TABLET BY MOUTH 2 (TWO) TIMES DAILY. 02/18/17   Midge Minium, MD  hydrochlorothiazide (HYDRODIURIL) 12.5 MG tablet Take by mouth. 06/11/19 06/10/20  [provider]  lisinopril (ZESTRIL) 20 MG tablet Take 1 tablet (20 mg total) by mouth daily. 07/06/19 10/04/19  Jodelle Red, MD  ondansetron (ZOFRAN) 4 MG tablet Take 1 tablet (4 mg total) by mouth every 8 (eight) hours as needed for nausea or vomiting. 04/11/21   Bennie Pierini, FNP  predniSONE (STERAPRED UNI-PAK 21 TAB) 10 MG (21) TBPK tablet 6,6,5,5,4,4,3,3,2,2,1,1 04/03/21   Pia Mau M, PA-C  ranitidine (ZANTAC) 150 MG tablet TAKE 1 TABLET BY MOUTH 2 (TWO) TIMES DAILY.  02/18/17   Midge Minium, MD  SUMAtriptan (IMITREX) 50 MG tablet Take 1 tablet (50 mg total) by mouth every 2 (two) hours as needed for migraine. May repeat in 2 hours if headache persists or recurs. Not more than 3 pills a week, no more than 2 pills in 24 hours 06/30/19   Huston Foley, MD    Family History Family History  Problem Relation Age of Onset   Diverticulitis Mother    Stroke Father    Heart disease Father    Hypertension Sister    Hypertension Sister     Social History Social History   Tobacco Use   Smoking status: Never   Smokeless tobacco: Never  Vaping Use    Vaping status: Never Used  Substance Use Topics   Alcohol use: Yes    Alcohol/week: 0.0 standard drinks of alcohol    Comment: social   Drug use: No     Allergies   Proton pump inhibitors   Review of Systems Review of Systems  Constitutional:  Positive for fever.  HENT:  Positive for sore throat.   Skin:  Positive for rash.  Neurological:  Positive for headaches.     Physical Exam Triage Vital Signs ED Triage Vitals  Encounter Vitals Group     BP      Systolic BP Percentile      Diastolic BP Percentile      Pulse      Resp      Temp      Temp src      SpO2      Weight      Height      Head Circumference      Peak Flow      Pain Score      Pain Loc      Pain Education      Exclude from Growth Chart    No data found.  Updated Vital Signs BP (!) 154/120 (BP Location: Left Arm)   Pulse 86   Temp 98.8 F (37.1 C) (Oral)   Ht 6\' 3"  (1.905 m)   Wt (!) 310 lb (140.6 kg)   SpO2 99%   BMI 38.75 kg/m   Visual Acuity Right Eye Distance:   Left Eye Distance:   Bilateral Distance:    Right Eye Near:   Left Eye Near:    Bilateral Near:     Physical Exam Vitals and nursing note reviewed.  Constitutional:      Appearance: Normal appearance. He is not ill-appearing.  HENT:     Head: Normocephalic and atraumatic.     Nose: Congestion present.     Comments: Patient has erythematous lesions inside both nares.    Mouth/Throat:     Mouth: Mucous membranes are moist.     Pharynx: Oropharyngeal exudate and posterior oropharyngeal erythema present.     Comments: Tonsillar pillars are 2+ edematous and erythematous with white exudate. Musculoskeletal:     Cervical back: Normal range of motion and neck supple.  Lymphadenopathy:     Cervical: No cervical adenopathy.  Skin:    General: Skin is warm and dry.     Capillary Refill: Capillary refill takes less than 2 seconds.     Findings: Erythema and rash present.  Neurological:     General: No focal deficit  present.     Mental Status: He is alert and oriented to person, place, and time.      UC Treatments / Results  Labs (  all labs ordered are listed, but only abnormal results are displayed) Labs Reviewed  GROUP A STREP BY PCR - Abnormal; Notable for the following components:      Result Value   Group A Strep by PCR DETECTED (*)    All other components within normal limits    EKG   Radiology No results found.  Procedures Procedures (including critical care time)  Medications Ordered in UC Medications - No data to display  Initial Impression / Assessment and Plan / UC Course  I have reviewed the triage vital signs and the nursing notes.  Pertinent labs & imaging results that were available during my care of the patient were reviewed by me and considered in my medical decision making (see chart for details).   Patient is a pleasant, nontoxic-appearing 42 year old male presenting for evaluation of skin rash with associated headache, fever, and sore throat as outlined HPI above.          As you can see in images above, the lesions are maculopapular but no appreciable vesicles.  He does have lesions that extend up into his hairline and some can be seen in his beard on his chin.  Tonsillar pillars are erythematous and edematous with milky exudate.  No appreciable lymphadenopathy on exam.  Differential diagnosis include hand-foot-and-mouth disease, strep pharyngitis, and monkeypox.  Given that I did do not find any intact pustules that are well-circumscribed or umbilicated I feel that monkeypox is less likely.  I will order a strep PCR.  Strep PCR is positive.  I will discharge patient home with a diagnosis of strep pharyngitis and strep rash on Augmentin 875 twice daily for 10 days.  Tylenol and/or ibuprofen according the package instructions as needed for fever.  Salt water gargles and over-the-counter Chloraseptic or Sucrets lozenges to help soothe the throat.  Return  precautions reviewed.  Work note provided.   Final Clinical Impressions(s) / UC Diagnoses   Final diagnoses:  Strep pharyngitis  Rash     Discharge Instructions      Take the Augmentin twice daily for 10 days for treatment of your strep throat.  Gargle with warm salt water 2-3 times a day to soothe your throat, aid in pain relief, and aid in healing.  Take over-the-counter ibuprofen according to the package instructions as needed for pain.  You can also use Chloraseptic or Sucrets lozenges, 1 lozenge every 2 hours as needed for throat pain.  If you develop any new or worsening symptoms return for reevaluation.      ED Prescriptions     Medication Sig Dispense Auth. Provider   amoxicillin-clavulanate (AUGMENTIN) 875-125 MG tablet Take 1 tablet by mouth every 12 (twelve) hours for 10 days. 20 tablet Becky Augusta, NP      PDMP not reviewed this encounter.   Becky Augusta, NP 06/13/23 1255

## 2023-06-13 NOTE — Discharge Instructions (Addendum)
Take the Augmentin twice daily for 10 days for treatment of your strep throat.  Gargle with warm salt water 2-3 times a day to soothe your throat, aid in pain relief, and aid in healing.  Take over-the-counter ibuprofen according to the package instructions as needed for pain.  You can also use Chloraseptic or Sucrets lozenges, 1 lozenge every 2 hours as needed for throat pain.  If you develop any new or worsening symptoms return for reevaluation.  

## 2023-06-13 NOTE — ED Triage Notes (Signed)
Pt is with his wife.  Pt c/o facial rask along nose, brow, cheeks, palm, and fingertips  Pt denies poison ivy, poison oak, spider bites.  Pt states that his throat is sore, he has a fever, and he has had a headache for the last 2days.  Pt states that the pustules have bursts and they are constantly painful.

## 2023-10-08 ENCOUNTER — Ambulatory Visit
Admission: EM | Admit: 2023-10-08 | Discharge: 2023-10-08 | Disposition: A | Discharge status: Home / Self Care | Attending: Emergency Medicine | Admitting: Emergency Medicine

## 2023-10-08 ENCOUNTER — Ambulatory Visit (INDEPENDENT_AMBULATORY_CARE_PROVIDER_SITE_OTHER)

## 2023-10-08 DIAGNOSIS — J40 Bronchitis, not specified as acute or chronic: Secondary | ICD-10-CM | POA: Insufficient documentation

## 2023-10-08 DIAGNOSIS — R052 Subacute cough: Secondary | ICD-10-CM | POA: Diagnosis present

## 2023-10-08 DIAGNOSIS — I10 Essential (primary) hypertension: Secondary | ICD-10-CM | POA: Insufficient documentation

## 2023-10-08 LAB — BASIC METABOLIC PANEL
Anion gap: 9 (ref 5–15)
BUN: 18 mg/dL (ref 6–20)
CO2: 23 mmol/L (ref 22–32)
Calcium: 9 mg/dL (ref 8.9–10.3)
Chloride: 102 mmol/L (ref 98–111)
Creatinine, Ser: 1.02 mg/dL (ref 0.61–1.24)
GFR, Estimated: >60 mL/min (ref >60)
Glucose, Bld: 104 mg/dL — ABNORMAL HIGH (ref 70–99)
Potassium: 4 mmol/L (ref 3.5–5.1)
Sodium: 134 mmol/L — ABNORMAL LOW (ref 135–145)

## 2023-10-08 MED ORDER — BENZONATATE 100 MG PO CAPS: 200.0000 mg | ORAL_CAPSULE | Freq: Three times a day (TID) | ORAL | 0 refills | Status: AC

## 2023-10-08 MED ORDER — HYDROCHLOROTHIAZIDE 12.5 MG PO TABS: 12.5000 mg | ORAL_TABLET | Freq: Every day | ORAL | 2 refills | Status: AC

## 2023-10-08 MED ORDER — DOXYCYCLINE HYCLATE 100 MG PO CAPS: 100.0000 mg | ORAL_CAPSULE | Freq: Two times a day (BID) | ORAL | 0 refills | Status: AC

## 2023-10-08 MED ORDER — PROMETHAZINE-DM 6.25-15 MG/5ML PO SYRP: 5.0000 mL | ORAL_SOLUTION | Freq: Four times a day (QID) | ORAL | 0 refills | Status: AC | PRN

## 2023-10-08 MED ORDER — ALBUTEROL SULFATE HFA 108 (90 BASE) MCG/ACT IN AERS: 2.0000 | INHALATION_SPRAY | RESPIRATORY_TRACT | 0 refills | Status: AC | PRN

## 2023-10-08 MED ORDER — LISINOPRIL 20 MG PO TABS: 20.0000 mg | ORAL_TABLET | Freq: Every day | ORAL | 11 refills | Status: AC

## 2023-10-08 MED ORDER — AEROCHAMBER MV MISC: 2 refills | Status: AC

## 2023-10-08 MED ORDER — AMLODIPINE BESYLATE 10 MG PO TABS: 10.0000 mg | ORAL_TABLET | Freq: Every day | ORAL | 2 refills | Status: AC

## 2023-10-08 NOTE — Discharge Instructions (Addendum)
 Your chest x-ray did not show any evidence of pneumonia but I will treat you for bronchitis.  Please take the doxycycline 100 g twice daily with food for 7 days for treatment of bronchitis.  Use the albuterol inhaler, with the spacer, 1 to 2 puffs every 4-6 hours, as needed for any shortness of breath or wheezing.  Use the Tessalon Perles every 8 hours during the day as needed for cough.  Take them with a small sip of water.  They may give you numbness to the base of your tongue, or metallic taste in your mouth, this is normal.  Use the Promethazine DM cough syrup at bedtime as needed for cough and congestion.  We can restart you on your antihypertensive agents lisinopril 20 mg daily, amlodipine 10 mg daily, and hydrochlorothiazide 12.5 mg daily.  Resume taking these for control of your blood pressure.  Make sure you are taking the hydrochlorothiazide in the morning as it is a diuretic which will make you urinate and you do not want to take it before bed.  Obtain a blood pressure cuff and monitor your blood pressure at home.  There is a form in your discharge paperwork for you to record your values on.  Make sure that when you take your blood pressure you are in a relaxed state for at least 30 minutes, your bladder is empty, you are sitting with your feet flat on the floor, and the arms were taking a reading and is at heart level for at least 5 minutes.  Keep your new patient appointment with you  Care provider that was established at discharge for ongoing medication management.

## 2023-10-08 NOTE — ED Provider Notes (Addendum)
 [facility]    CSN: [id number] Arrival date & time: 10/08/23  4098      History   Chief Complaint Chief Complaint  Patient presents with   Cough    HPI Eddie Daniels is a 43 y.o. male.   HPI  43 year old male with past medical history significant for hypertension, snoring, and GERD presents for evaluation of 2 weeks worth of productive cough with associated shortness of breath and wheezing.  He is also complaining of itching in both of his ears but denies other associated upper respiratory symptoms.  He reports that both of his children had a cough but their symptoms have improved and his are worsening.  He denies fever.  Past Medical History:  Diagnosis Date   GERD (gastroesophageal reflux disease)    Hypertension    questionable.  said BP has been up when checked but has not seen PCP about it   Snores     Patient Active Problem List   Diagnosis Date Noted   Family history of hypertension 08/03/2019   Heartburn    Diarrhea    Reflux esophagitis    Gastritis    Noninfectious gastroenteritis     Past Surgical History:  Procedure Laterality Date   CHOLECYSTECTOMY     COLONOSCOPY WITH PROPOFOL N/A 07/13/2015   Procedure: COLONOSCOPY WITH PROPOFOL;  Surgeon: Midge Minium, MD;  Location: [facility];  Service: Endoscopy;  Laterality: N/A;   ESOPHAGOGASTRODUODENOSCOPY (EGD) WITH PROPOFOL N/A 07/13/2015   Procedure: ESOPHAGOGASTRODUODENOSCOPY (EGD) WITH PROPOFOL;  Surgeon: Midge Minium, MD;  Location: [facility];  Service: Endoscopy;  Laterality: N/A;   INCISION AND DRAINAGE Right 01/25/2016   Procedure: INCISION AND DRAINAGE;  Surgeon: Betha Loa, MD;  Location: [facility];  Service: Orthopedics;  Laterality: Right;  right ring I&D and pinning/repair nail bed    Left hand surgery     Broken at age 42       Home Medications    Prior to Admission medications   Medication Sig Start Date End Date Taking? Authorizing Provider   albuterol (VENTOLIN HFA) 108 (90 Base) MCG/ACT inhaler Inhale 2 puffs into the lungs every 4 (four) hours as needed. 10/08/23  Yes Becky Augusta, NP  benzonatate (TESSALON) 100 MG capsule Take 2 capsules (200 mg total) by mouth every 8 (eight) hours. 10/08/23  Yes Becky Augusta, NP  doxycycline (VIBRAMYCIN) 100 MG capsule Take 1 capsule (100 mg total) by mouth 2 (two) times daily for 7 days. 10/08/23 10/15/23 Yes Becky Augusta, NP  promethazine-dextromethorphan (PROMETHAZINE-DM) 6.25-15 MG/5ML syrup Take 5 mLs by mouth 4 (four) times daily as needed. 10/08/23  Yes Becky Augusta, NP  Spacer/Aero-Holding Chambers (AEROCHAMBER MV) inhaler Use as instructed 10/08/23  Yes Becky Augusta, NP  amLODipine (NORVASC) 10 MG tablet Take 1 tablet (10 mg total) by mouth daily. 10/08/23   Becky Augusta, NP  aspirin-acetaminophen-caffeine (EXCEDRIN MIGRAINE) [phone] MG tablet Take 2 tablets by mouth every 6 (six) hours as needed for headache.    [provider]  dicyclomine (BENTYL) 20 MG tablet TAKE 1 TABLET BY MOUTH 2 (TWO) TIMES DAILY. 02/18/17   Midge Minium, MD  hydrochlorothiazide (HYDRODIURIL) 12.5 MG tablet Take 1 tablet (12.5 mg total) by mouth daily. 10/08/23 01/06/24  Becky Augusta, NP  lisinopril (ZESTRIL) 20 MG tablet Take 1 tablet (20 mg total) by mouth daily. 10/08/23 01/06/24  Becky Augusta, NP  ondansetron (ZOFRAN) 4 MG tablet Take 1 tablet (4 mg total) by mouth every 8 (eight)  hours as needed for nausea or vomiting. 04/11/21   Bennie Pierini, FNP  predniSONE (STERAPRED UNI-PAK 21 TAB) 10 MG (21) TBPK tablet 6,6,5,5,4,4,3,3,2,2,1,1 04/03/21   Pia Mau M, PA-C  ranitidine (ZANTAC) 150 MG tablet TAKE 1 TABLET BY MOUTH 2 (TWO) TIMES DAILY. 02/18/17   Midge Minium, MD  SUMAtriptan (IMITREX) 50 MG tablet Take 1 tablet (50 mg total) by mouth every 2 (two) hours as needed for migraine. May repeat in 2 hours if headache persists or recurs. Not more than 3 pills a week, no more than 2 pills in 24 hours 06/30/19    Huston Foley, MD    Family History Family History  Problem Relation Age of Onset   Diverticulitis Mother    Stroke Father    Heart disease Father    Hypertension Sister    Hypertension Sister     Social History Social History   Tobacco Use   Smoking status: Never   Smokeless tobacco: Never  Vaping Use   Vaping status: Never Used  Substance Use Topics   Alcohol use: Yes    Alcohol/week: 0.0 standard drinks of alcohol    Comment: social   Drug use: No     Allergies   Proton pump inhibitors   Review of Systems Review of Systems  Constitutional:  Negative for fever.  HENT:  Positive for ear pain. Negative for congestion and rhinorrhea.        Bilateral ear itching  Respiratory:  Positive for cough, shortness of breath and wheezing.      Physical Exam Triage Vital Signs ED Triage Vitals  Encounter Vitals Group     BP      Systolic BP Percentile      Diastolic BP Percentile      Pulse      Resp      Temp      Temp src      SpO2      Weight      Height      Head Circumference      Peak Flow      Pain Score      Pain Loc      Pain Education      Exclude from Growth Chart    No data found.  Updated Vital Signs BP (!) 159/116 (BP Location: Left Arm)   Pulse 77   Temp 98.6 F (37 C) (Oral)   Resp 17   SpO2 98%   Visual Acuity Right Eye Distance:   Left Eye Distance:   Bilateral Distance:    Right Eye Near:   Left Eye Near:    Bilateral Near:     Physical Exam Vitals and nursing note reviewed.  Constitutional:      Appearance: Normal appearance. He is not ill-appearing.  HENT:     Head: Normocephalic and atraumatic.     Right Ear: Tympanic membrane and external ear normal. There is no impacted cerumen.     Left Ear: Tympanic membrane and external ear normal. There is no impacted cerumen.     Ears:     Comments: Patient has mild amounts of dried cerumen in both external auditory canals.  His canal is otherwise unremarkable and both TMs  are pearly gray in appearance with normal light reflex. Cardiovascular:     Rate and Rhythm: Normal rate and regular rhythm.     Pulses: Normal pulses.     Heart sounds: Normal heart sounds. No murmur heard.  No friction rub. No gallop.  Pulmonary:     Effort: Pulmonary effort is normal.     Breath sounds: Wheezing and rhonchi present. No rales.     Comments: Patient has diffuse mild wheezing and rhonchi. Skin:    General: Skin is warm and dry.     Capillary Refill: Capillary refill takes less than 2 seconds.     Findings: No rash.  Neurological:     General: No focal deficit present.     Mental Status: He is alert and oriented to person, place, and time.      UC Treatments / Results  Labs (all labs ordered are listed, but only abnormal results are displayed) Labs Reviewed  BASIC METABOLIC PANEL - Abnormal; Notable for the following components:      Result Value   Sodium 134 (*)    Glucose, Bld 104 (*)    All other components within normal limits    EKG Normal sinus rhythm with a ventricular rate of 70 bpm Peer interval 130 ms QRS duration 82 ms QT/QTc 370/424 ms No ST or T wave abnormalities noted.  Radiology DG Chest 2 View Result Date: 10/08/2023 CLINICAL DATA:  Shortness of breath, productive cough for 2 weeks. EXAM: CHEST - 2 VIEW COMPARISON:  None Available. FINDINGS: The heart size and mediastinal contours are within normal limits. Both lungs are clear. The visualized skeletal structures are unremarkable. IMPRESSION: No active cardiopulmonary disease. Electronically Signed   By: Lupita Raider M.D.   On: 10/08/2023 13:23    Procedures Procedures (including critical care time)  Medications Ordered in UC Medications - No data to display  Initial Impression / Assessment and Plan / UC Course  I have reviewed the triage vital signs and the nursing notes.  Pertinent labs & imaging results that were available during my care of the patient were reviewed by me  and considered in my medical decision making (see chart for details).   Patient is a nontoxic-appearing 43 year old male presenting for evaluation to his with respiratory symptoms outlined HPI above.  He reports that he is experiencing short of breath and wheezing though he is able to speak in full sentences dyspnea or tachypnea.  Respiratory rate at triage was 17 with an oxygen saturation on room air of 98%.  He is afebrile with an oral temp of 98.6.  Cardiopulmonary exam reveals S1-S2 heart sounds with regular rate and rhythm and lung sounds at exhibit mild diffuse wheezing and rhonchi.  I suspect the patient has bronchitis based upon his chest auscultation, however, I will obtain a chest x-ray to rule out any acute cardiopulmonary pathology.  I will also obtain an EKG to evaluate for any signs of heart strain and a BMP to evaluate the patient's renal function.  He reports that he has not been taking his blood pressure medication for the last 4 years because his PCP quit his practice and he has not established a new PCP.  I will have staff status establish primary care provider for the patient prior to discharge.  EKG shows normal sinus rhythm without T wave or ST abnormalities.  No change when compared to EKG from 04/03/2021.  Chest x-ray independently reviewed and evaluated by me.  Impression: Lung fields are well aerated without evidence of infiltrate or effusion.  Cardiomediastinal silhouette appears normal.  Radiology overread is pending.   Radiology impression states no active cardiopulmonary disease.  BMP shows normal renal function with mild hyponatremia sodium 134.  I will  discharge patient with diagnosis of bronchitis and start him on doxycycline 100 mg twice daily for 7 days along with Tessalon Perles and Promethazine DM cough syrup for cough and congestion albuterol inhaler with spacer that he can use every 4-6 hours as needed for shortness breath or wheezing.  I will restart him on lisinopril  20 mg daily for control of his blood pressure along with amlodipine 10 mg and hydrochlorothiazide 12.5 mg.   Final Clinical Impressions(s) / UC Diagnoses   Final diagnoses:  Subacute cough  Bronchitis  Essential hypertension     Discharge Instructions      Your chest x-ray did not show any evidence of pneumonia but I will treat you for bronchitis.  Please take the doxycycline 100 g twice daily with food for 7 days for treatment of bronchitis.  Use the albuterol inhaler, with the spacer, 1 to 2 puffs every 4-6 hours, as needed for any shortness of breath or wheezing.  Use the Tessalon Perles every 8 hours during the day as needed for cough.  Take them with a small sip of water.  They may give you numbness to the base of your tongue, or metallic taste in your mouth, this is normal.  Use the Promethazine DM cough syrup at bedtime as needed for cough and congestion.  We can restart you on your antihypertensive agents lisinopril 20 mg daily, amlodipine 10 mg daily, and hydrochlorothiazide 12.5 mg daily.  Resume taking these for control of your blood pressure.  Make sure you are taking the hydrochlorothiazide in the morning as it is a diuretic which will make you urinate and you do not want to take it before bed.  Obtain a blood pressure cuff and monitor your blood pressure at home.  There is a form in your discharge paperwork for you to record your values on.  Make sure that when you take your blood pressure you are in a relaxed state for at least 30 minutes, your bladder is empty, you are sitting with your feet flat on the floor, and the arms were taking a reading and is at heart level for at least 5 minutes.  Keep your new patient appointment with you  Care provider that was established at discharge for ongoing medication management.     ED Prescriptions     Medication Sig Dispense Auth. Provider   amLODipine (NORVASC) 10 MG tablet Take 1 tablet (10 mg total) by mouth daily. 30  tablet Becky Augusta, NP   hydrochlorothiazide (HYDRODIURIL) 12.5 MG tablet Take 1 tablet (12.5 mg total) by mouth daily. 30 tablet Becky Augusta, NP   lisinopril (ZESTRIL) 20 MG tablet Take 1 tablet (20 mg total) by mouth daily. 30 tablet Becky Augusta, NP   Spacer/Aero-Holding Chambers (AEROCHAMBER MV) inhaler Use as instructed 1 each Becky Augusta, NP   albuterol (VENTOLIN HFA) 108 (90 Base) MCG/ACT inhaler Inhale 2 puffs into the lungs every 4 (four) hours as needed. 18 g Becky Augusta, NP   benzonatate (TESSALON) 100 MG capsule Take 2 capsules (200 mg total) by mouth every 8 (eight) hours. 21 capsule Becky Augusta, NP   promethazine-dextromethorphan (PROMETHAZINE-DM) 6.25-15 MG/5ML syrup Take 5 mLs by mouth 4 (four) times daily as needed. 118 mL Becky Augusta, NP   doxycycline (VIBRAMYCIN) 100 MG capsule Take 1 capsule (100 mg total) by mouth 2 (two) times daily for 7 days. 14 capsule Becky Augusta, NP      PDMP not reviewed this encounter.   Becky Augusta, NP 10/08/23  1326    Becky Augusta, NP 10/08/23 1327

## 2023-10-08 NOTE — ED Triage Notes (Signed)
 Sx x 2 weeks  Productive cough with green mucus Chest congestion Bilateral ear itching

## 2023-10-17 ENCOUNTER — Ambulatory Visit: Admitting: Student
